# Patient Record
Sex: Female | Born: 1945
Health system: Southern US, Community
[De-identification: ages and names within clinical notes are randomized; demographics above are authoritative.]

## PROBLEM LIST (undated history)

## (undated) HISTORY — PX: ABDOMINAL HYSTERECTOMY: SHX81

## (undated) HISTORY — PX: CHOLECYSTECTOMY: SHX55

## (undated) HISTORY — PX: APPENDECTOMY: SHX54

---

## 2000-11-25 ENCOUNTER — Other Ambulatory Visit: Admission: RE | Admit: 2000-11-25 | Discharge: 2000-11-25 | Payer: Self-pay | Admitting: *Deleted

## 2001-12-15 ENCOUNTER — Other Ambulatory Visit: Admission: RE | Admit: 2001-12-15 | Discharge: 2001-12-15 | Payer: Self-pay | Admitting: *Deleted

## 2005-11-25 ENCOUNTER — Other Ambulatory Visit: Admission: RE | Admit: 2005-11-25 | Discharge: 2005-11-25 | Payer: Self-pay | Admitting: Obstetrics and Gynecology

## 2006-01-27 ENCOUNTER — Encounter (INDEPENDENT_AMBULATORY_CARE_PROVIDER_SITE_OTHER): Payer: Self-pay | Admitting: Specialist

## 2006-01-27 ENCOUNTER — Ambulatory Visit (HOSPITAL_COMMUNITY): Admission: RE | Admit: 2006-01-27 | Discharge: 2006-01-27 | Payer: Self-pay | Admitting: Gastroenterology

## 2008-04-05 ENCOUNTER — Emergency Department (HOSPITAL_COMMUNITY): Admission: EM | Admit: 2008-04-05 | Discharge: 2008-04-06 | Payer: Self-pay | Admitting: Emergency Medicine

## 2008-04-08 ENCOUNTER — Ambulatory Visit (HOSPITAL_COMMUNITY): Admission: RE | Admit: 2008-04-08 | Discharge: 2008-04-09 | Payer: Self-pay | Admitting: Orthopedic Surgery

## 2009-10-31 IMAGING — CR DG WRIST COMPLETE 3+V*L*
4 series · 4 of 4 positions shown · non-contrast
Comparison: None

CLINICAL DATA: Wrist injury marked deformity

LEFT WRIST - COMPLETE 3+ VIEW

[lat wrist]
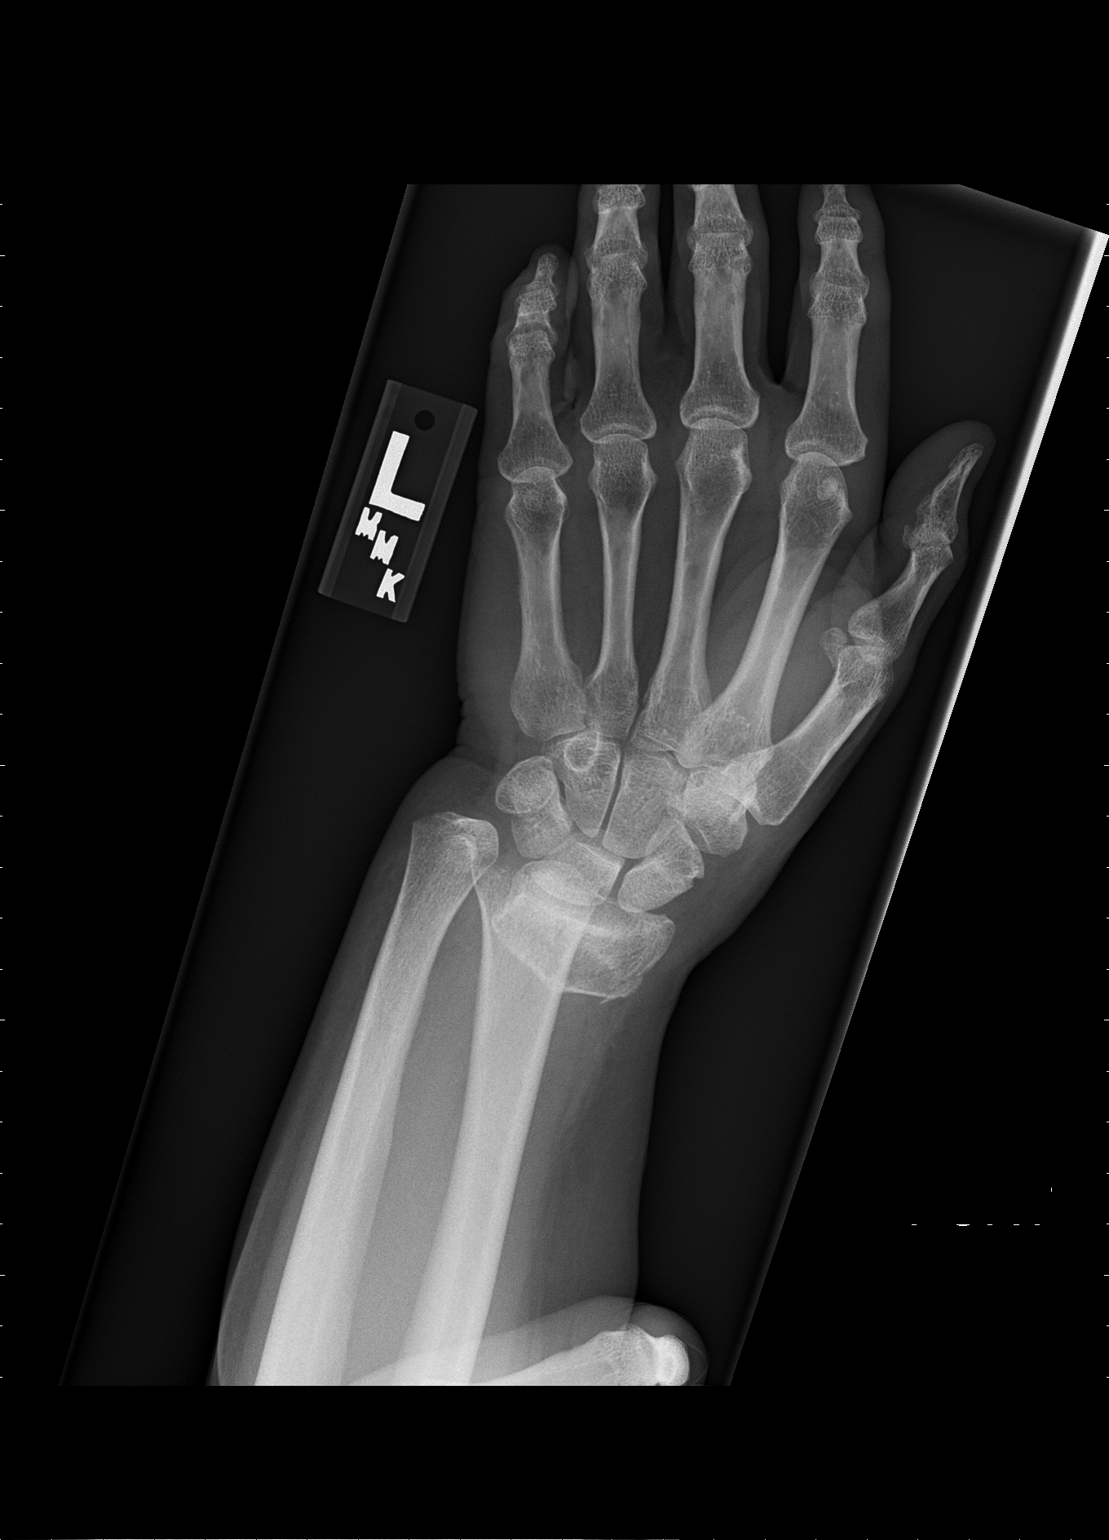

[PA (1 of 3)]
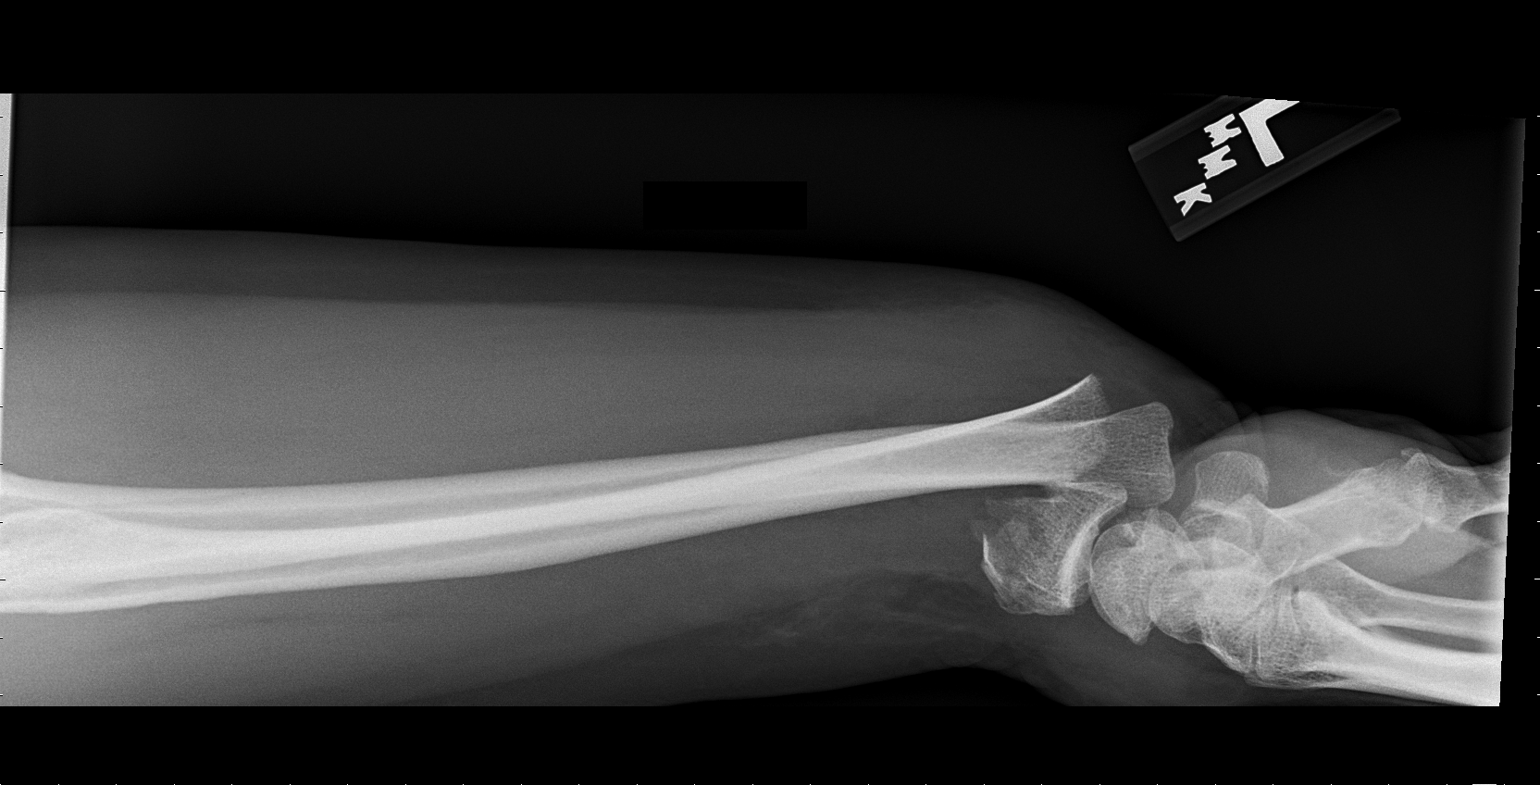

[PA (2 of 3)]
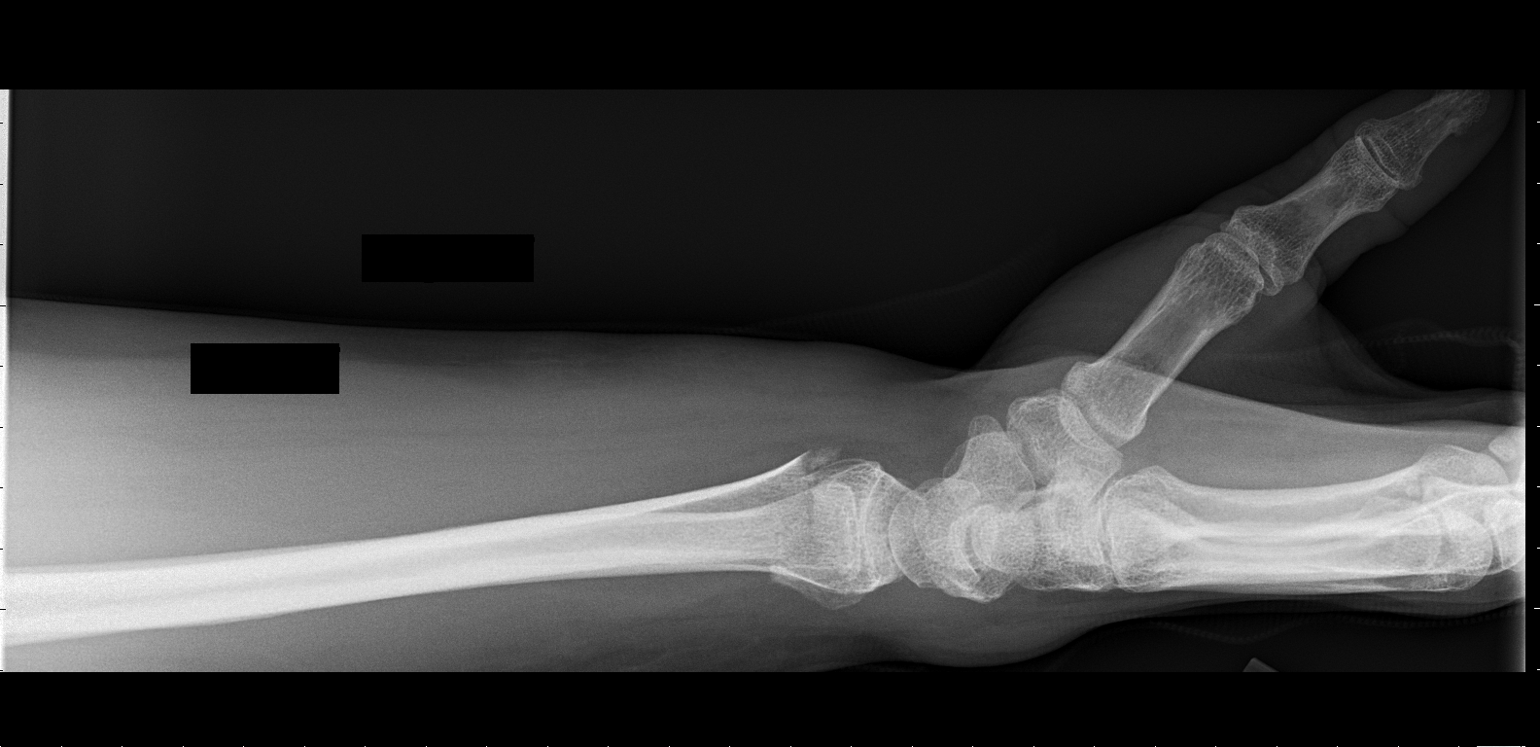

[PA (3 of 3)]
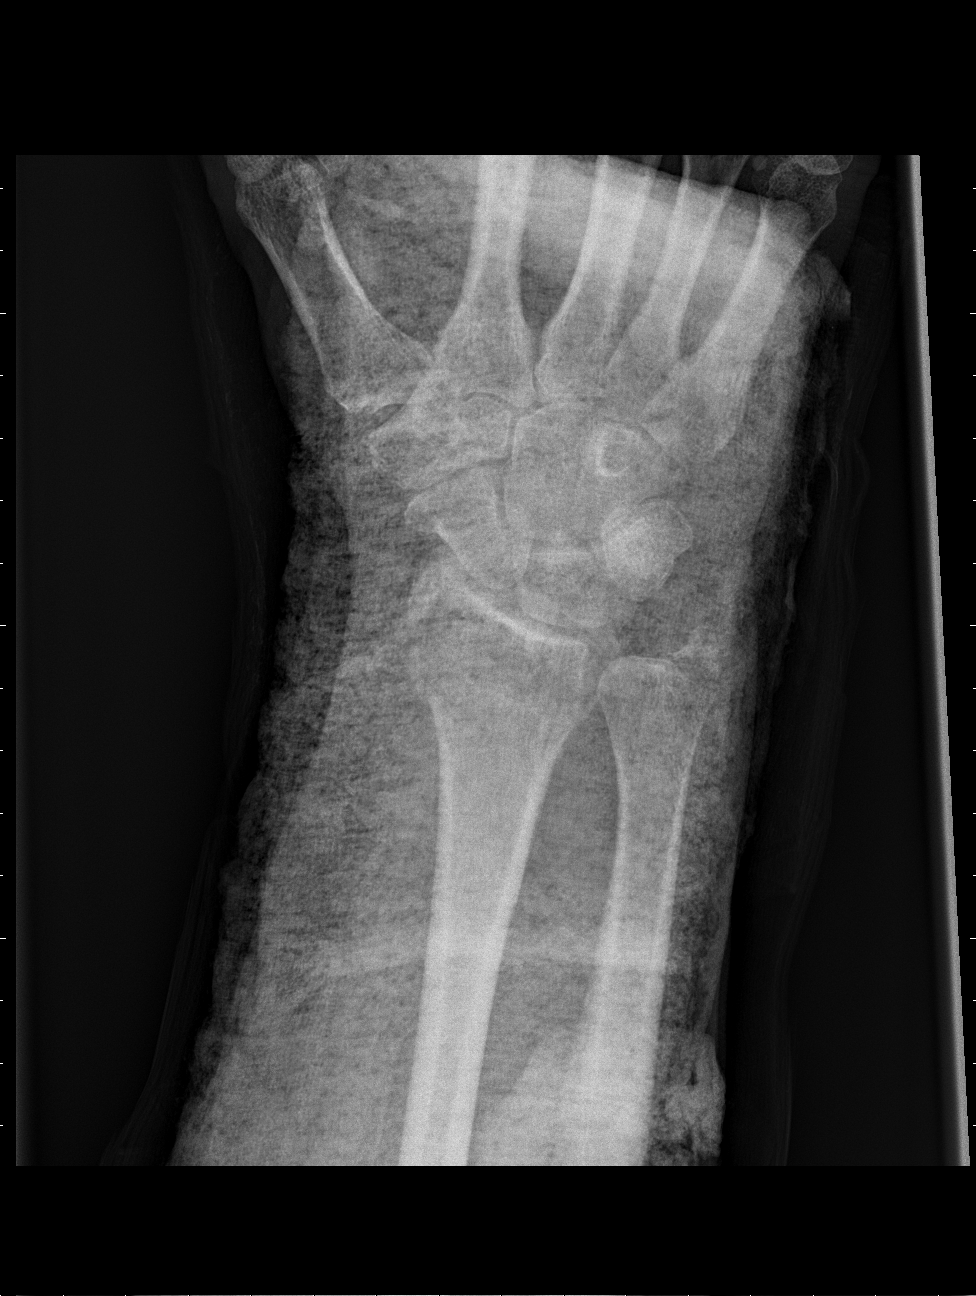

[4 of 4 positions shown; findings below may reference images not displayed]

FINDINGS: The initial radiograph showed a complete transverse
distal radial metaphyseal fracture with one full bone width
inferior displacement.  Associated volar dislocation of the ulna.
Subsequent post reduction radiographs demonstrate improved
alignment with near anatomic positioning.  AP in plaster shows near
anatomic alignment.
IMPRESSION: Distal radial fracture with volar displacement of the distal ulna.
Post reduction shows near anatomic alignment

## 2011-05-14 NOTE — H&P (Signed)
NAMEPENNY, ARRAMBIDE              ACCOUNT NO.:  [id number]   MEDICAL RECORD NO.:  [id number]           PATIENT TYPE:   LOCATION:                                 FACILITY:   PHYSICIAN:  Dionne Ano. Gramig III, M.D.DATE OF BIRTH:  04/29/1946   DATE OF ADMISSION:  04/05/2008  DATE OF DISCHARGE:                              HISTORY & PHYSICAL   Joyce Stevens is a 65 year old, right-hand dominant female found with  outstretched left upper extremity completely displaced distal radius and  ulna fracture April 05, 2008. She presented to the emergency room after  10:00 p.m.  I was asked to see in consult.  She denies other injury.  She is here today with her daughter.  She has a severely displaced left  distal radius fracture.  I have reviewed this issue with her at length.   ALLERGIES:  None.   MEDICINES:  Aspirin.   PAST MEDICAL HISTORY:  Occasional headache.   PAST SURGICAL HISTORY:  1. Hysterectomy.  2. Cholecystectomy.  3. C-section.   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 She does  not smoke or drink.   PHYSICAL EXAMINATION:  GENERAL:  Pleasant female, alert and oriented, no  acute distress.  Bilateral lower extremity examination is benign.  CHEST:  Clear.  ABDOMEN:  Nontender.  EXTREMITIES:  Right upper extremity has IV in the antecubital fossa with  infiltrate.  We have switched that to her wrist and hand-based IV.  She  is neurovascularly intact in right upper extremity.  Left lower  extremity shows a significantly displaced distal radius fracture, closed  with ecchymosis and abrasion with stable ligamentous examination. She  has difficulty moving the fingers but is sensate about the radial,  median, ulnar nerve distribution.   I reviewed this at length.  I have gone ahead and evaluated her x-rays  and found a completely displaced distal radius and ulna fracture.  I  performed a hematoma block, and, following this, she underwent  manipulative  reduction after I verbally consented her. Manipulative  reduction was performed, and this placed the bone in excellent position.  I was pleased with the findings. Following placing the bone in excellent  position, post reduction x-rays were taken which showed excellent  position in the AP and lateral view.  She tolerated this well.  There  were no complicating features.  She has some tingling in her fingers  which we attribute to the lidocaine hematoma block and the reduction.  We are going to keep our eyes very close any evolving median nerve  issues or acute carpal tunnel syndrome.  Given the widely displaced  nature of her fracture, I would expect some degree of contusive injury  as well to the nerve.   I would recommend definitive stabilization at a convenient time in the  future and discussed with her the risks and benefits of bleeding,  infection, anesthesia, damage to normal structures, and failure of  surgery to accomplish its intended goals of relieving symptoms and  restoring function, typically patent screw fixation, 23-hour overnight  stay, preoperative antibiotics, pain  management, etc.  I have discharged  her tonight on Phenergan p.r.n. nausea, Dilaudid for pain, and in  addition to this, ice, elevation, finger movement, and call me for any  problems.  We are going to set her up for elective outpatient ORIF at a  mutually convenient time in the future.  If she should have any  worsening problems of evidence of compartment syndrome, she will let me  know.  At the present time, there is no evidence of compartment  syndrome, and she is stable.  She is awake, alert, and oriented at time  of discharge and understands the surgical algorithm and plans.           ______________________________  Dionne Ano. Everlene Other, M.D.     Nash Mantis  D:  04/05/2008  T:  04/06/2008  Job:  [id number]

## 2011-05-14 NOTE — Op Note (Signed)
NAMELASHONNE, SHULL              ACCOUNT NO.:  [id number]   MEDICAL RECORD NO.:  [id number]          PATIENT TYPE:  AMB   LOCATION:  DAY                          FACILITY:  [facility]   PHYSICIAN:  Dionne Ano. Gramig III, M.D.DATE OF BIRTH:  09-Apr-1946   DATE OF PROCEDURE:  04/08/2008  DATE OF DISCHARGE:                               OPERATIVE REPORT   PREOPERATIVE DIAGNOSIS:  Comminuted left wrist fracture status post  initial closed reduction presents for open reduction internal fixation  and reconstruction as necessary.   POSTOPERATIVE DIAGNOSIS:  Comminuted left wrist fracture status post  initial closed reduction presents for open reduction internal fixation  and reconstruction as necessary.   PROCEDURE:  1. Open reduction internal fixation with DVR plate and screw construct      and OsteoSet bone graft (5 mL) left distal radius fracture,      comminuted complex greater than five parts.  2. Stretch radiography.  3. Brachioradialis tenotomy.   SURGEON:  Dominica Severin, M.D.   ASSISTANT:  Karie Chimera, PA-C.   COMPLICATIONS:  None.   ANESTHESIA:  General.   TOURNIQUET TIME:  Less than an hour.   DRAINS:  One TLS drain.   INDICATIONS FOR PROCEDURE:  The patient is a pleasant 51-year-year-old  female who presents with above mentioned diagnosis.  I have counseled  her in regards to the risks and benefits of surgery including risk of  infection, bleeding, anesthesia, damage to internal structures and  failure of surgery to accomplish its intended goals of relieving  symptoms and restoring function.  With this in mind, she desires to  proceed.  All questions have been encouraged and answered  preoperatively.   OPERATIVE PROCEDURE:  The patient was seen by myself and anesthesia,  taken to the operating suite and had induction of general anesthesia,  time-out was called.  Ancef was given preoperatively.  Arm was marked  and all questions were encouraged and answered.  The  operation commenced  with volar radial approach to the wrist under 250 mm of tourniquet  control approximately and a sterile prep and drape performed prior to  the incision.  Dissection was carried down.  The FCR tendon sheath was  incised palmarly and distally.  I performed a fasciotomy of the volar  soft tissues and following this the pronator was elevated in a radial to  ulnar direction exposing the fracture site.  The fracture was very  comminuted.  In order to gain reduction, I very carefully slid retractor  and performed a brachioradialis tenotomy.  This allowed for a lessening  radial pull to allow for fracture reduction.  Following the  brachioradialis tenotomy, I then placed a large amount of bone graft in  the form of 5 mL of OsteoSet bone graft.  I then reassembled the bony  fracture.  I was able to create excellent alignment and applied a DVR  plate in standard AO technique.  Radial inclination, volar tilt and  radial height were excellent and I was pleased with the findings.  The  distal radioulnar joint was stable and not encroached  upon by  the  hardware.  Following this, I checked stretch radiography revealing  excellent position of the fracture construct and good stability and I  was pleased with the findings.  I deflated tourniquet, irrigated  copiously with multiple lavages of irrigant and then closed the pronator  with Vicryl, subcu was closed with Vicryl and Prolene was used in the  skin edge.  The patient had a small TLS drain placed in the deep  tissues.  This will be removed tomorrow.  She had soft compartments.  No  complicating features.  She will be a placed a short-arm splint and  transferred to the recovery room.  She tolerated this well.  There were  no complicating features.  She will be admitted for IV antibiotics, pain  control and general postop observatory measures.  We will plan for a  standard DVR plate and screw construct rehabilitation protocol.  I  will  begin interval active range of motion at 4 weeks postop with removable  splint and at 8 weeks postop general interval strengthening predicated  on x-rays looking well.  It has been a pleasure to participate in her  care.  She is a Scientist, product/process development. No blood products were used.  I had a  thorough and thoughtful conversation with her in regards to this prior  to the procedure of course.           ______________________________  Dionne Ano. Everlene Other, M.D.     Nash Mantis  D:  04/08/2008  T:  04/08/2008  Job:  [id number]

## 2011-05-17 NOTE — Op Note (Signed)
NAMEOSCAR, FORMAN              ACCOUNT NO.:  [id number]   MEDICAL RECORD NO.:  [id number]          PATIENT TYPE:  AMB   LOCATION:  ENDO                         FACILITY:  [facility]   PHYSICIAN:  Bernette Redbird, M.D.   DATE OF BIRTH:  February 03, 1946   DATE OF PROCEDURE:  01/27/2006  DATE OF DISCHARGE:                                 OPERATIVE REPORT   PROCEDURE:  Colonoscopy with biopsy.   INDICATIONS:  Initial screening exam in a standard risk 65 year old Jehovah  Witness.   FINDINGS:  Small polyp removed. Small sigmoid AVM.   DESCRIPTION OF PROCEDURE:  The nature, purpose, and risks of the procedure  had been discussed with the patient who provided written consent. Sedation  was fentanyl 75 mcg and Versed 7 mg IV without arrhythmias or desaturation.  The Olympus adjustable tension pediatric video colonoscope was advanced to  the cecum without significant difficulty, using a little bit of external  abdominal compression to control looping. The cecum was clearly identified  by visualization of the appendiceal orifice and pullback was then performed.  The quality of prep was excellent and it is felt that all areas were well  seen.   On the way in, near the hepatic flexure, I encountered a 4 mm  semipedunculated polyp removed by two cold biopsies. There was a minimal  ooze, perhaps a quarter of a mL of blood at most, which was endoscopically  confirmed to clot with complete hemostasis thereafter.   No other polyps were seen.   In the sigmoid region was a red blotch approximately 1.5 cm x 1 cm in  diameter, possibly representing an AVM although discrete telangiectatic  vessels within it could not really be identified.   The colon was otherwise normal. No cancer, large polyps, colitis, vascular  malformations or diverticulosis were noted. Retroflexion in the rectum and  reinspection of the rectum were unremarkable. The patient tolerated the  procedure well and there were no apparent  complications.   IMPRESSION:  1.  Colon polyp removed as described above (211.3).  2.  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   PLAN:  Await pathology results.           ______________________________  Bernette Redbird, M.D.     RB/MEDQ  D:  01/27/2006  T:  01/27/2006  Job:  [id number]   cc:   Aram Beecham P. Romine, M.D.  Fax: [phone]   [facility] at [facility]

## 2011-09-24 LAB — HEMOGLOBIN AND HEMATOCRIT, BLOOD
HCT: 38.3
Hemoglobin: 13.2

## 2012-08-21 ENCOUNTER — Ambulatory Visit: Payer: PRIVATE HEALTH INSURANCE | Attending: Family Medicine | Admitting: Occupational Therapy

## 2012-08-21 DIAGNOSIS — M2550 Pain in unspecified joint: Secondary | ICD-10-CM | POA: Insufficient documentation

## 2012-08-21 DIAGNOSIS — IMO0001 Reserved for inherently not codable concepts without codable children: Secondary | ICD-10-CM | POA: Insufficient documentation

## 2012-08-21 DIAGNOSIS — M6281 Muscle weakness (generalized): Secondary | ICD-10-CM | POA: Insufficient documentation

## 2012-09-02 ENCOUNTER — Ambulatory Visit: Payer: PRIVATE HEALTH INSURANCE | Attending: Family Medicine

## 2012-09-02 DIAGNOSIS — M6281 Muscle weakness (generalized): Secondary | ICD-10-CM | POA: Insufficient documentation

## 2012-09-02 DIAGNOSIS — M2550 Pain in unspecified joint: Secondary | ICD-10-CM | POA: Insufficient documentation

## 2012-09-02 DIAGNOSIS — Z5189 Encounter for other specified aftercare: Secondary | ICD-10-CM | POA: Insufficient documentation

## 2012-09-07 ENCOUNTER — Ambulatory Visit: Payer: PRIVATE HEALTH INSURANCE | Admitting: *Deleted

## 2012-09-11 ENCOUNTER — Encounter: Payer: Self-pay | Admitting: *Deleted

## 2012-09-14 ENCOUNTER — Encounter: Payer: Self-pay | Admitting: Occupational Therapy

## 2012-09-21 ENCOUNTER — Ambulatory Visit: Payer: PRIVATE HEALTH INSURANCE | Attending: Family Medicine | Admitting: Occupational Therapy

## 2012-09-21 DIAGNOSIS — IMO0001 Reserved for inherently not codable concepts without codable children: Secondary | ICD-10-CM | POA: Insufficient documentation

## 2012-09-21 DIAGNOSIS — M2550 Pain in unspecified joint: Secondary | ICD-10-CM | POA: Insufficient documentation

## 2012-09-21 DIAGNOSIS — M6281 Muscle weakness (generalized): Secondary | ICD-10-CM | POA: Insufficient documentation

## 2012-09-25 ENCOUNTER — Ambulatory Visit: Payer: PRIVATE HEALTH INSURANCE | Admitting: Occupational Therapy

## 2012-09-28 ENCOUNTER — Encounter: Payer: Self-pay | Admitting: Occupational Therapy

## 2012-10-02 ENCOUNTER — Encounter: Payer: Self-pay | Admitting: Occupational Therapy

## 2012-10-05 ENCOUNTER — Encounter: Payer: Self-pay | Admitting: Occupational Therapy

## 2012-10-09 ENCOUNTER — Encounter: Payer: Self-pay | Admitting: Occupational Therapy

## 2012-10-12 ENCOUNTER — Encounter: Payer: Self-pay | Admitting: Occupational Therapy

## 2012-10-16 ENCOUNTER — Encounter: Payer: Self-pay | Admitting: Occupational Therapy

## 2016-02-15 ENCOUNTER — Encounter (HOSPITAL_COMMUNITY): Payer: Self-pay | Admitting: Emergency Medicine

## 2016-02-15 ENCOUNTER — Emergency Department (HOSPITAL_COMMUNITY): Payer: Medicare Other

## 2016-02-15 ENCOUNTER — Emergency Department (HOSPITAL_COMMUNITY)
Admission: EM | Admit: 2016-02-15 | Discharge: 2016-02-15 | Disposition: A | Discharge status: Home / Self Care | Payer: Medicare Other | Attending: Emergency Medicine | Admitting: Emergency Medicine

## 2016-02-15 DIAGNOSIS — R110 Nausea: Secondary | ICD-10-CM | POA: Insufficient documentation

## 2016-02-15 DIAGNOSIS — I959 Hypotension, unspecified: Secondary | ICD-10-CM | POA: Insufficient documentation

## 2016-02-15 DIAGNOSIS — Z90710 Acquired absence of both cervix and uterus: Secondary | ICD-10-CM | POA: Insufficient documentation

## 2016-02-15 DIAGNOSIS — R6883 Chills (without fever): Secondary | ICD-10-CM | POA: Diagnosis not present

## 2016-02-15 DIAGNOSIS — R55 Syncope and collapse: Secondary | ICD-10-CM | POA: Diagnosis not present

## 2016-02-15 DIAGNOSIS — R42 Dizziness and giddiness: Secondary | ICD-10-CM | POA: Insufficient documentation

## 2016-02-15 DIAGNOSIS — R682 Dry mouth, unspecified: Secondary | ICD-10-CM | POA: Diagnosis not present

## 2016-02-15 DIAGNOSIS — R001 Bradycardia, unspecified: Secondary | ICD-10-CM | POA: Insufficient documentation

## 2016-02-15 DIAGNOSIS — R1011 Right upper quadrant pain: Secondary | ICD-10-CM | POA: Diagnosis present

## 2016-02-15 DIAGNOSIS — Z9049 Acquired absence of other specified parts of digestive tract: Secondary | ICD-10-CM | POA: Insufficient documentation

## 2016-02-15 LAB — URINALYSIS, ROUTINE W REFLEX MICROSCOPIC
Bilirubin Urine: NEGATIVE
Glucose, UA: NEGATIVE mg/dL
Hgb urine dipstick: NEGATIVE
Ketones, ur: NEGATIVE mg/dL
Leukocytes, UA: NEGATIVE
Nitrite: NEGATIVE
Protein, ur: NEGATIVE mg/dL
Specific Gravity, Urine: 1.005 (ref 1.005–1.030)
pH: 6 (ref 5.0–8.0)

## 2016-02-15 LAB — TROPONIN I: Troponin I: <0.03 ng/mL

## 2016-02-15 LAB — COMPREHENSIVE METABOLIC PANEL WITH GFR
ALT: 44 U/L (ref 14–54)
AST: 39 U/L (ref 15–41)
Albumin: 3.3 g/dL — ABNORMAL LOW (ref 3.5–5.0)
Alkaline Phosphatase: 44 U/L (ref 38–126)
Anion gap: 11 (ref 5–15)
BUN: 17 mg/dL (ref 6–20)
CO2: 22 mmol/L (ref 22–32)
Calcium: 8.9 mg/dL (ref 8.9–10.3)
Chloride: 109 mmol/L (ref 101–111)
Creatinine, Ser: 0.78 mg/dL (ref 0.44–1.00)
GFR calc Af Amer: >60 mL/min
GFR calc non Af Amer: >60 mL/min
Glucose, Bld: 128 mg/dL — ABNORMAL HIGH (ref 65–99)
Potassium: 4.1 mmol/L (ref 3.5–5.1)
Sodium: 142 mmol/L (ref 135–145)
Total Bilirubin: 0.5 mg/dL (ref 0.3–1.2)
Total Protein: 6.5 g/dL (ref 6.5–8.1)

## 2016-02-15 LAB — CBC
HCT: 36 % (ref 36.0–46.0)
Hemoglobin: 11.6 g/dL — ABNORMAL LOW (ref 12.0–15.0)
MCH: 29.5 pg (ref 26.0–34.0)
MCHC: 32.2 g/dL (ref 30.0–36.0)
MCV: 91.6 fL (ref 78.0–100.0)
Platelets: 361 10*3/uL (ref 150–400)
RBC: 3.93 MIL/uL (ref 3.87–5.11)
RDW: 12.7 % (ref 11.5–15.5)
WBC: 9.4 10*3/uL (ref 4.0–10.5)

## 2016-02-15 LAB — MAGNESIUM: Magnesium: 2.1 mg/dL (ref 1.7–2.4)

## 2016-02-15 LAB — LIPASE, BLOOD: Lipase: 20 U/L (ref 11–51)

## 2016-02-15 MED ORDER — SODIUM CHLORIDE 0.9 % IV BOLUS (SEPSIS)
1000.0000 mL | Freq: Once | INTRAVENOUS | Status: AC
  Administered 2016-02-15: 1000 mL via INTRAVENOUS

## 2016-02-15 NOTE — Discharge Instructions (Signed)
Joyce Stevens, you work up today was normal.  You likely had an episode of vasovagal bradycardia.  You need to see your primary care doctor or cardiologist within 3 days for close follow up.  If any symptoms worsen, come back to the ED Immediately.  Thank you.   Syncope, commonly known as fainting, is a temporary loss of consciousness. It occurs when the blood flow to the brain is reduced. Vasovagal syncope (also called neurocardiogenic syncope) is a fainting spell in which the blood flow to the brain is reduced because of a sudden drop in heart rate and blood pressure. Vasovagal syncope occurs when the brain and the cardiovascular system (blood vessels) do not adequately communicate and respond to each other. This is the most common cause of fainting. It often occurs in response to fear or some other type of emotional or physical stress. The body has a reaction in which the heart starts beating too slowly or the blood vessels expand, reducing blood pressure. This type of fainting spell is generally considered harmless. However, injuries can occur if a person takes a sudden fall during a fainting spell.  CAUSES  Vasovagal syncope occurs when a person's blood pressure and heart rate decrease suddenly, usually in response to a trigger. Many things and situations can trigger an episode. Some of these include:   Pain.   Fear.   The sight of blood or medical procedures, such as blood being drawn from a vein.   Common activities, such as coughing, swallowing, stretching, or going to the bathroom.   Emotional stress.   Prolonged standing, especially in a warm environment.   Lack of sleep or rest.   Prolonged lack of food.   Prolonged lack of fluids.   Recent illness.  The use of certain drugs that affect blood pressure, such as cocaine, alcohol, marijuana, inhalants, and opiates.  SYMPTOMS  Before the fainting episode, you may:   Feel dizzy or light headed.   Become pale.  Sense  that you are going to faint.   Feel like the room is spinning.   Have tunnel vision, only seeing directly in front of you.   Feel sick to your stomach (nauseous).   See spots or slowly lose vision.   Hear ringing in your ears.   Have a headache.   Feel warm and sweaty.   Feel a sensation of pins and needles. During the fainting spell, you will generally be unconscious for no longer than a couple minutes before waking up and returning to normal. If you get up too quickly before your body can recover, you may faint again. Some twitching or jerky movements may occur during the fainting spell.  DIAGNOSIS  Your health care provider will ask about your symptoms, take a medical history, and perform a physical exam. Various tests may be done to rule out other causes of fainting. These may include blood tests and tests to check the heart, such as electrocardiography, echocardiography, and possibly an electrophysiology study. When other causes have been ruled out, a test may be done to check the body's response to changes in position (tilt table test). TREATMENT  Most cases of vasovagal syncope do not require treatment. Your health care provider may recommend ways to avoid fainting triggers and may provide home strategies for preventing fainting. If you must be exposed to a possible trigger, you can drink additional fluids to help reduce your chances of having an episode of vasovagal syncope. If you have warning signs of an  oncoming episode, you can respond by positioning yourself favorably (lying down). If your fainting spells continue, you may be given medicines to prevent fainting. Some medicines may help make you more resistant to repeated episodes of vasovagal syncope. Special exercises or compression stockings may be recommended. In rare cases, the surgical placement of a pacemaker is considered. HOME CARE INSTRUCTIONS   Learn to identify the warning signs of vasovagal syncope.   Sit  or lie down at the first warning sign of a fainting spell. If sitting, put your head down between your legs. If you lie down, swing your legs up in the air to increase blood flow to the brain.   Avoid hot tubs and saunas.  Avoid prolonged standing.  Drink enough fluids to keep your urine clear or pale yellow. Avoid caffeine.  Increase salt in your diet as directed by your health care provider.   If you have to stand for a long time, perform movements such as:   Crossing your legs.   Flexing and stretching your leg muscles.   Squatting.   Moving your legs.   Bending over.   Only take over-the-counter or prescription medicines as directed by your health care provider. Do not suddenly stop any medicines without asking your health care provider first. [place] IF:   Your fainting spells continue or happen more frequently in spite of treatment.   You lose consciousness for more than a couple minutes.  You have fainting spells during or after exercising or after being startled.   You have new symptoms that occur with the fainting spells, such as:   Shortness of breath.  Chest pain.   Irregular heartbeat.   You have episodes of twitching or jerky movements that last longer than a few seconds.  You have episodes of twitching or jerky movements without obvious fainting. SEEK IMMEDIATE MEDICAL CARE IF:   You have injuries or bleeding after a fainting spell.   You have episodes of twitching or jerky movements that last longer than 5 minutes.   You have more than one spell of twitching or jerky movements before returning to consciousness after fainting.   This information is not intended to replace advice given to you by your health care provider. Make sure you discuss any questions you have with your health care provider.   Document Released: 12/02/2012 Document Revised: 05/02/2015 Document Reviewed: 12/02/2012 Elsevier Interactive Patient Education  [employer].

## 2016-02-15 NOTE — ED Provider Notes (Addendum)
CSN: [id number]     Arrival date & time 02/15/16  0246 History  By signing my name below, I, Altamease Oiler, attest that this documentation has been prepared under the direction and in the presence of Everlene Balls, MD. Electronically Signed: Altamease Oiler, ED Scribe. 02/15/2016. 3:49 AM   Chief Complaint  Patient presents with  . Abdominal Pain  . Hypotension    The history is provided by the patient. No language interpreter was used.   Brought in by EMS, Joyce Stevens is a 70 y.o. female who presents to the Emergency Department complaining of new right upper abdominal pain with sudden onset just PTA after urinating. She is unable to describe the pain apart from saying that her stomach is "upset, like a stomach bug, like before you have diarrhea". Her pain has improved since initially onset. She notes having homemade soup and mixed fruit for dinner. Associated symptoms include dry mouth, chills, and light headedness . She was noted to be bradycardic by EMS. Pt denies change in appetite, sweating, vomiting, diarrhea, chest pain, SOB. She uses Excedrin frequently with milk. Past surgical history is significant for cholecystectomy, appendectomy, and abdominal hysterectomy.   History reviewed. No pertinent past medical history. Past Surgical History  Procedure Laterality Date  . Cholecystectomy    . Appendectomy    . Abdominal hysterectomy     Family History  Problem Relation Age of Onset  . Heart attack Brother    Social History  Substance Use Topics  . Smoking status: Never Smoker   . Smokeless tobacco: None  . Alcohol Use: No   OB History    No data available     Review of Systems  10 Systems reviewed and all are negative for acute change except as noted in the HPI.   Allergies  Review of patient's allergies indicates no known allergies.  Home Medications   Prior to Admission medications   Not on File   BP 150/86 mmHg  Pulse 67  Temp(Src) 98.1 F (36.7 C)  (Oral)  Resp 15  Ht 5\' 1"  (1.549 m)  Wt 155 lb (70.308 kg)  BMI 29.30 kg/m2  SpO2 100% Physical Exam  Constitutional: She is oriented to person, place, and time. She appears well-developed and well-nourished. No distress.  HENT:  Head: Normocephalic and atraumatic.  Nose: Nose normal.  Mouth/Throat: Oropharynx is clear and moist. No oropharyngeal exudate.  Eyes: Conjunctivae and EOM are normal. Pupils are equal, round, and reactive to light. No scleral icterus.  Neck: Normal range of motion. Neck supple. No JVD present. No tracheal deviation present. No thyromegaly present.  Cardiovascular: Normal rate, regular rhythm and normal heart sounds.  Exam reveals no gallop and no friction rub.   No murmur heard. Pulmonary/Chest: Effort normal and breath sounds normal. No respiratory distress. She has no wheezes. She exhibits no tenderness.  Abdominal: Soft. Bowel sounds are normal. She exhibits no distension and no mass. There is no tenderness. There is no rebound and no guarding.  Musculoskeletal: Normal range of motion. She exhibits no edema or tenderness.  Lymphadenopathy:    She has no cervical adenopathy.  Neurological: She is alert and oriented to person, place, and time. No cranial nerve deficit. She exhibits normal muscle tone.  Skin: Skin is warm and dry. No rash noted. No erythema. No pallor.  Nursing note and vitals reviewed.   ED Course  Procedures (including critical care time) DIAGNOSTIC STUDIES: Oxygen Saturation is 100% on RA,  normal by my  interpretation.    COORDINATION OF CARE: 3:36 AM Discussed treatment plan which includes lab work, EKG, and CXR with pt at bedside and pt agreed to plan.  Labs Review Labs Reviewed  COMPREHENSIVE METABOLIC PANEL - Abnormal; Notable for the following:    Glucose, Bld 128 (*)    Albumin 3.3 (*)    All other components within normal limits  CBC - Abnormal; Notable for the following:    Hemoglobin 11.6 (*)    All other components  within normal limits  LIPASE, BLOOD  URINALYSIS, ROUTINE W REFLEX MICROSCOPIC (NOT AT [facility])  MAGNESIUM  TROPONIN I    Imaging Review Dg Chest 2 View  02/15/2016  CLINICAL DATA:  Bradycardia.  Near syncope. EXAM: CHEST  2 VIEW COMPARISON:  None. FINDINGS: The heart size and mediastinal contours are within normal limits. Both lungs are clear. The visualized skeletal structures are unremarkable. IMPRESSION: No active cardiopulmonary disease. Electronically Signed   By: Lucienne Capers M.D.   On: 02/15/2016 04:24   I have personally reviewed and evaluated these images and lab results as part of my medical decision-making.   EKG Interpretation   Date/Time:  Thursday February 15 2016 02:53:43 EST Ventricular Rate:  68 PR Interval:  160 QRS Duration: 98 QT Interval:  429 QTC Calculation: 456 R Axis:   -4 Text Interpretation:  Sinus rhythm Low voltage, extremity and precordial  leads No significant change since last tracing Confirmed by Glynn Octave [phone]) on 02/15/2016 3:25:28 AM      MDM   Final diagnoses:  None    Patient presents emergency department for near syncope, nausea, and bradycardia. It is likely patient had a vasovagal episode. She was observed in the emergency department and given IV fluids. Infectious workup was negative, troponin is negative as well. Emergency depart workup is negative. EKG does not show any AV blocks. She is advised to follow with her primary care physician or cardiology within 3 days for close management. She appears well in no acute distress, patient states she is back to her baseline. Vital signs were within her normal limits and she is safe for discharge.    I personally performed the services described in this documentation, which was scribed in my presence. The recorded information has been reviewed and is accurate.      Everlene Balls, MD 02/15/16 [place], MD 02/15/16 [phone]

## 2016-02-15 NOTE — ED Notes (Signed)
Pt arrives by [facility] with c/o of LUQ discomfort, weakness, faint, chills, and nausea. Pt took Pepto and drank ginger ale, did not help. EMS reports HR in 40s and BP 68/43 on arrival. 20g in left AC and 441mL bolus. Pulse went 67 and BP 156/72. CBG 158. Pt denied SOB. No cardiac history.

## 2016-02-15 NOTE — ED Notes (Signed)
Patient transported to X-ray 

## 2016-02-22 NOTE — Progress Notes (Signed)
Cardiology Office Note   Date:  02/23/2016   ID:  TIMARA DOHNER, DOB 1946/09/17, MRN [id number]  PCP:  Aretta Nip, MD  Cardiologist:   Minus Breeding, MD   No chief complaint on file.     History of Present Illness: Joyce Stevens is a 70 y.o. female who presents for evaluation of presyncope. This happened on 2/17. She was in bed and had some discomfort.  This may have been related to food she ate. With this she had a feeling of dread near syncope. She became diaphoretic and nauseated.   She had a feeling like I was going through her veins. EMS was called and she was currently hypotensive and bradycardic. I reviewed emergency room records. This was thought to be a vagal episode. Her enzymes were normal except she is mildly anemic. EKG was unremarkable for acute changes although she has chronic poor anterior R wave progression. There were no other abnormalities. She's never had this kind of episode before and hasn't since. She otherwise exercises routinely.  She walks daily and does other exercises without any symptoms.   The patient denies any new symptoms such as chest discomfort, neck or arm discomfort. There has been no new shortness of breath, PND or orthopnea. There have been no reported palpitations, presyncope or syncope.  She does have significant stress and we discussed this in detail.  PMH:  Headaches   Past Surgical History  Procedure Laterality Date  . Cholecystectomy    . Appendectomy    . Abdominal hysterectomy       Current Outpatient Prescriptions  Medication Sig Dispense Refill  . fluticasone (FLONASE) 50 MCG/ACT nasal spray Place 2 sprays into both nostrils daily. USE TWO SPRAYS ONCE A DAY AS NEEDED NASALLY  5  . GARLIC PO Take 1 tablet by mouth daily.    . S-Adenosylmethionine (SAM-E PO) Take by mouth.    . S-Adenosylmethionine (SAM-E) 200 MG TABS Take 1 tablet by mouth daily.    . SUMAtriptan (IMITREX) 50 MG tablet Use as needed  2   No current  facility-administered medications for this visit.    Allergies:   Review of patient's allergies indicates no known allergies.    Social History:  The patient  reports that she has never smoked. She has never used smokeless tobacco. She reports that she does not drink alcohol or use illicit drugs.   Family History:  The patient's family history includes Heart attack in her brother.    ROS:  Please see the history of present illness.   Otherwise, review of systems are positive for fatigue.   All other systems are reviewed and negative.    PHYSICAL EXAM: VS:  BP 134/86 mmHg  Pulse 72  Ht 5\' 1"  (1.549 m)  Wt 160 lb (72.576 kg)  BMI 30.25 kg/m2 , BMI Body mass index is 30.25 kg/(m^2). GENERAL:  Well appearing HEENT:  Pupils equal round and reactive, fundi not visualized, oral mucosa unremarkable NECK:  No jugular venous distention, waveform within normal limits, carotid upstroke brisk and symmetric, no bruits, no thyromegaly LYMPHATICS:  No cervical, inguinal adenopathy LUNGS:  Clear to auscultation bilaterally BACK:  No CVA tenderness CHEST:  Unremarkable HEART:  PMI not displaced or sustained,S1 and S2 within normal limits, no S3, no S4, no clicks, no rubs, no murmurs ABD:  Flat, positive bowel sounds normal in frequency in pitch, no bruits, no rebound, no guarding, no midline pulsatile mass, no hepatomegaly, no splenomegaly EXT:  2 plus pulses throughout, no edema, no cyanosis no clubbing SKIN:  No rashes no nodules NEURO:  Cranial nerves II through XII grossly intact, motor grossly intact throughout PSYCH:  Cognitively intact, oriented to person place and time    EKG:  EKG is ordered today. The ekg ordered today demonstrates sinus rhythm, rate 73, axis within normal limits, intervals within normal limits, poor anterior R wave progression.  This is unchanged from previous EKGs   Recent Labs: 02/15/2016: ALT 44; BUN 17; Creatinine, Ser 0.78; Hemoglobin 11.6*; Magnesium 2.1;  Platelets 361; Potassium 4.1; Sodium 142    Lipid Panel No results found for: CHOL, TRIG, HDL, CHOLHDL, VLDL, LDLCALC, LDLDIRECT    Wt Readings from Last 3 Encounters:  02/23/16 160 lb (72.576 kg)  02/15/16 155 lb (70.308 kg)      Other studies Reviewed: Additional studies/ records that were reviewed today include: Dr. Dahlia Bailiff records, ED records. Review of the above records demonstrates:  Please see elsewhere in the note.     ASSESSMENT AND PLAN:  PRESYNCOPE:  This is consistent with a vagal episode. I reviewed the emergency room records and there were no acute findings. She's had no symptoms before or since. No change in therapy is indicated. I don't think further study would be helpful at this point. Certainly if she has any recurrent symptoms I would be happy to evaluate.  ANXIETY:  I encourage continued follow up with Aretta Nip, MD   Current medicines are reviewed at length with the patient today.  The patient does not have concerns regarding medicines.  The following changes have been made:  no change  Labs/ tests ordered today include: none  No orders of the defined types were placed in this encounter.     Disposition:   FU with me as needed.      Signed, Minus Breeding, MD  02/23/2016 1:59 PM    [facility]

## 2016-02-23 ENCOUNTER — Encounter: Payer: Self-pay | Admitting: Cardiology

## 2016-02-23 ENCOUNTER — Ambulatory Visit (INDEPENDENT_AMBULATORY_CARE_PROVIDER_SITE_OTHER): Payer: Medicare Other | Admitting: Cardiology

## 2016-02-23 VITALS — BP 134/86 | HR 72 | Ht 61.0 in | Wt 160.0 lb

## 2016-02-23 DIAGNOSIS — R55 Syncope and collapse: Secondary | ICD-10-CM

## 2016-02-23 NOTE — Patient Instructions (Signed)
Dr Hochrein recommends that you follow-up with him as needed. 

## 2017-01-20 DIAGNOSIS — R69 Illness, unspecified: Secondary | ICD-10-CM | POA: Diagnosis not present

## 2017-04-23 DIAGNOSIS — M79601 Pain in right arm: Secondary | ICD-10-CM | POA: Diagnosis not present

## 2017-05-05 DIAGNOSIS — G43009 Migraine without aura, not intractable, without status migrainosus: Secondary | ICD-10-CM | POA: Diagnosis not present

## 2017-05-05 DIAGNOSIS — Z6831 Body mass index (BMI) 31.0-31.9, adult: Secondary | ICD-10-CM | POA: Diagnosis not present

## 2017-05-05 DIAGNOSIS — R69 Illness, unspecified: Secondary | ICD-10-CM | POA: Diagnosis not present

## 2017-05-05 DIAGNOSIS — Z0000 Encounter for general adult medical examination without abnormal findings: Secondary | ICD-10-CM | POA: Diagnosis not present

## 2017-05-05 DIAGNOSIS — E669 Obesity, unspecified: Secondary | ICD-10-CM | POA: Diagnosis not present

## 2017-05-05 DIAGNOSIS — J301 Allergic rhinitis due to pollen: Secondary | ICD-10-CM | POA: Diagnosis not present

## 2017-05-05 DIAGNOSIS — G4700 Insomnia, unspecified: Secondary | ICD-10-CM | POA: Diagnosis not present

## 2017-05-05 DIAGNOSIS — M542 Cervicalgia: Secondary | ICD-10-CM | POA: Diagnosis not present

## 2017-09-03 DIAGNOSIS — Z1231 Encounter for screening mammogram for malignant neoplasm of breast: Secondary | ICD-10-CM | POA: Diagnosis not present

## 2017-09-03 DIAGNOSIS — Z803 Family history of malignant neoplasm of breast: Secondary | ICD-10-CM | POA: Diagnosis not present

## 2017-09-08 DIAGNOSIS — G43009 Migraine without aura, not intractable, without status migrainosus: Secondary | ICD-10-CM | POA: Diagnosis not present

## 2017-09-08 DIAGNOSIS — Z23 Encounter for immunization: Secondary | ICD-10-CM | POA: Diagnosis not present

## 2017-09-11 IMAGING — DX DG CHEST 2V
2 series · 2 of 2 positions shown · non-contrast
Comparison: None.

CLINICAL DATA: Bradycardia.  Near syncope.

EXAM:
CHEST  2 VIEW

[chest pa]
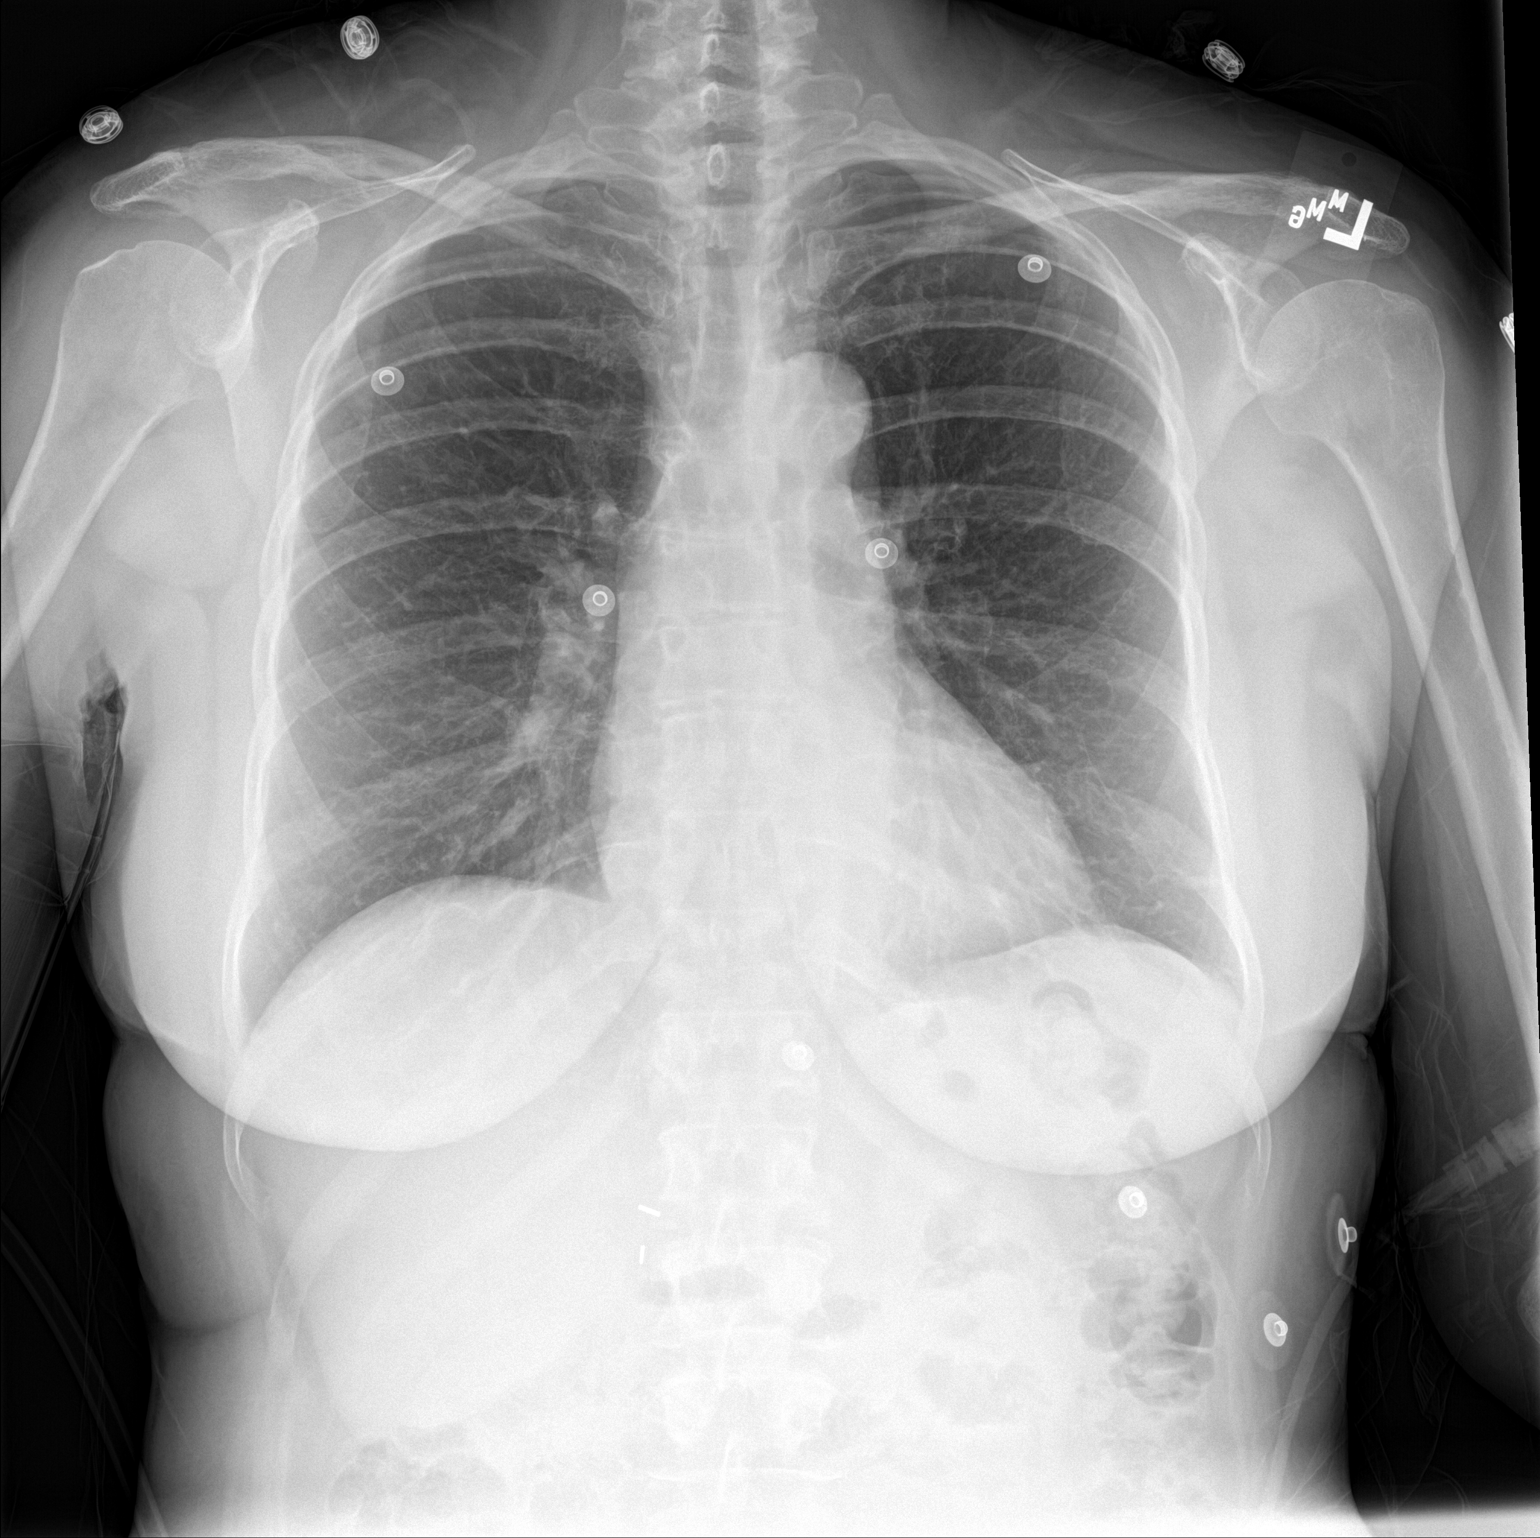

[chest lat]
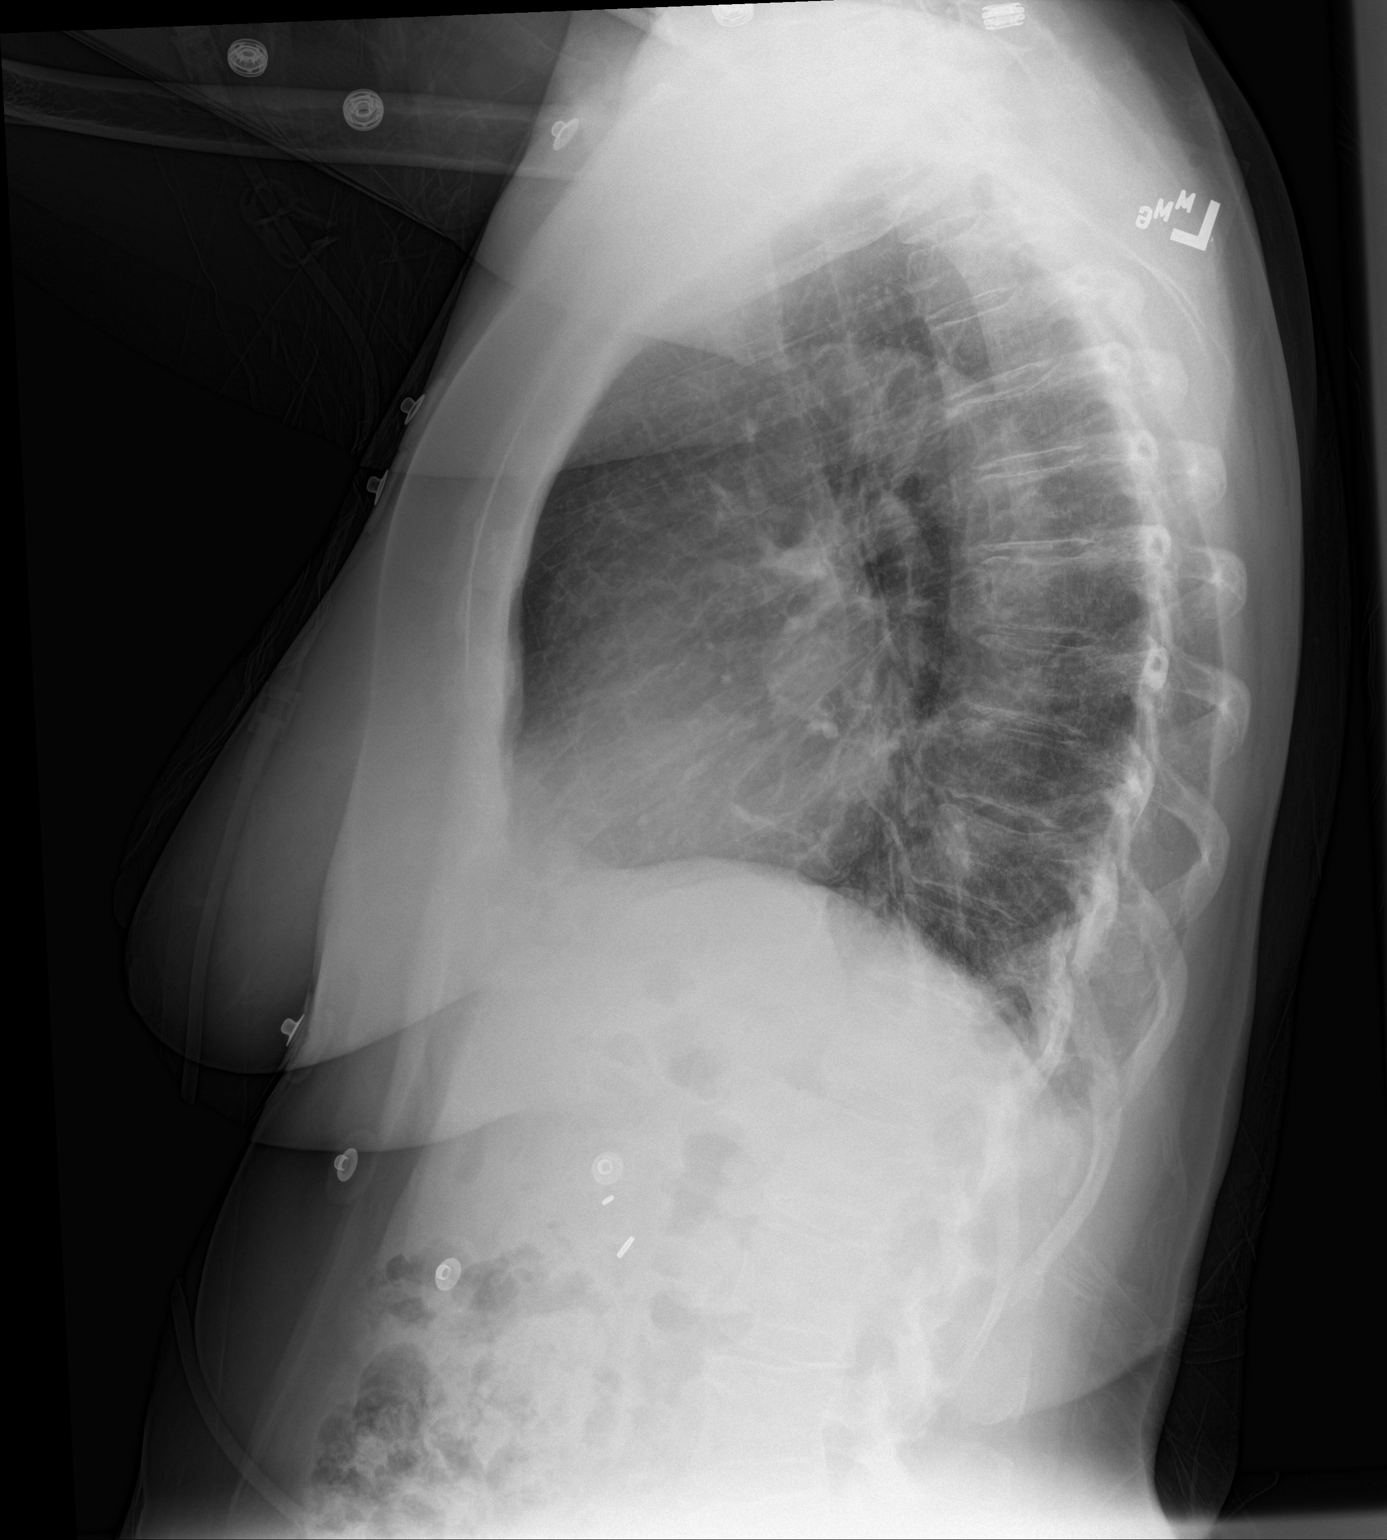

[2 of 2 positions shown; findings below may reference images not displayed]

FINDINGS: The heart size and mediastinal contours are within normal limits.
Both lungs are clear. The visualized skeletal structures are
unremarkable.
IMPRESSION: No active cardiopulmonary disease.

## 2017-10-03 DIAGNOSIS — R69 Illness, unspecified: Secondary | ICD-10-CM | POA: Diagnosis not present

## 2018-04-02 DIAGNOSIS — H40013 Open angle with borderline findings, low risk, bilateral: Secondary | ICD-10-CM | POA: Diagnosis not present

## 2018-04-04 DIAGNOSIS — Z0100 Encounter for examination of eyes and vision without abnormal findings: Secondary | ICD-10-CM | POA: Diagnosis not present

## 2018-04-10 DIAGNOSIS — Z1389 Encounter for screening for other disorder: Secondary | ICD-10-CM | POA: Diagnosis not present

## 2018-04-10 DIAGNOSIS — G43009 Migraine without aura, not intractable, without status migrainosus: Secondary | ICD-10-CM | POA: Diagnosis not present

## 2018-04-10 DIAGNOSIS — Z1159 Encounter for screening for other viral diseases: Secondary | ICD-10-CM | POA: Diagnosis not present

## 2018-04-10 DIAGNOSIS — Z0000 Encounter for general adult medical examination without abnormal findings: Secondary | ICD-10-CM | POA: Diagnosis not present

## 2018-04-10 DIAGNOSIS — Z79899 Other long term (current) drug therapy: Secondary | ICD-10-CM | POA: Diagnosis not present

## 2018-04-10 DIAGNOSIS — Z13220 Encounter for screening for lipoid disorders: Secondary | ICD-10-CM | POA: Diagnosis not present

## 2018-04-10 DIAGNOSIS — Z131 Encounter for screening for diabetes mellitus: Secondary | ICD-10-CM | POA: Diagnosis not present

## 2018-06-10 DIAGNOSIS — R69 Illness, unspecified: Secondary | ICD-10-CM | POA: Diagnosis not present

## 2018-09-09 DIAGNOSIS — Z1231 Encounter for screening mammogram for malignant neoplasm of breast: Secondary | ICD-10-CM | POA: Diagnosis not present

## 2018-10-06 DIAGNOSIS — R69 Illness, unspecified: Secondary | ICD-10-CM | POA: Diagnosis not present

## 2018-11-09 DIAGNOSIS — S61219A Laceration without foreign body of unspecified finger without damage to nail, initial encounter: Secondary | ICD-10-CM | POA: Diagnosis not present

## 2018-11-19 DIAGNOSIS — Z4802 Encounter for removal of sutures: Secondary | ICD-10-CM | POA: Diagnosis not present

## 2018-11-19 DIAGNOSIS — S61211D Laceration without foreign body of left index finger without damage to nail, subsequent encounter: Secondary | ICD-10-CM | POA: Diagnosis not present

## 2019-05-05 DIAGNOSIS — Z0100 Encounter for examination of eyes and vision without abnormal findings: Secondary | ICD-10-CM | POA: Diagnosis not present

## 2019-08-02 DIAGNOSIS — Z0000 Encounter for general adult medical examination without abnormal findings: Secondary | ICD-10-CM | POA: Diagnosis not present

## 2019-08-09 DIAGNOSIS — G43909 Migraine, unspecified, not intractable, without status migrainosus: Secondary | ICD-10-CM | POA: Diagnosis not present

## 2019-08-23 DIAGNOSIS — Z1211 Encounter for screening for malignant neoplasm of colon: Secondary | ICD-10-CM | POA: Diagnosis not present

## 2019-08-23 DIAGNOSIS — Z1212 Encounter for screening for malignant neoplasm of rectum: Secondary | ICD-10-CM | POA: Diagnosis not present

## 2019-09-07 DIAGNOSIS — R69 Illness, unspecified: Secondary | ICD-10-CM | POA: Diagnosis not present

## 2020-02-20 ENCOUNTER — Ambulatory Visit: Payer: Medicare HMO | Attending: Internal Medicine

## 2020-02-20 DIAGNOSIS — Z23 Encounter for immunization: Secondary | ICD-10-CM | POA: Insufficient documentation

## 2020-02-20 NOTE — Progress Notes (Signed)
   Covid-19 Vaccination Clinic  Name:  Joyce Stevens    MRN: [id number] DOB: 10/28/1946  02/20/2020  [name] was observed post Covid-19 immunization for 15 minutes without incidence. She was provided with Vaccine Information Sheet and instruction to access the V-Safe system.   [name] was instructed to call 911 with any severe reactions post vaccine: Marland Kitchen Difficulty breathing  . Swelling of your face and throat  . A fast heartbeat  . A bad rash all over your body  . Dizziness and weakness    Immunizations Administered    Name Date Dose VIS Date Route   Pfizer COVID-19 Vaccine 02/20/2020 10:12 AM 0.3 mL 12/10/2019 Intramuscular   Manufacturer: [place]   Lot: [id number]   [place]: [id number]

## 2020-03-15 ENCOUNTER — Ambulatory Visit: Payer: Medicare HMO | Attending: Internal Medicine

## 2020-03-15 DIAGNOSIS — Z23 Encounter for immunization: Secondary | ICD-10-CM

## 2020-03-15 NOTE — Progress Notes (Signed)
   Covid-19 Vaccination Clinic  Name:  Joyce Stevens    MRN: [id number] DOB: 07-14-1946  03/15/2020  [name] was observed post Covid-19 immunization for 15 minutes without incident. She was provided with Vaccine Information Sheet and instruction to access the V-Safe system.   [name] was instructed to call 911 with any severe reactions post vaccine: Marland Kitchen Difficulty breathing  . Swelling of face and throat  . A fast heartbeat  . A bad rash all over body  . Dizziness and weakness   Immunizations Administered    Name Date Dose VIS Date Route   Pfizer COVID-19 Vaccine 03/15/2020  8:34 AM 0.3 mL 12/10/2019 Intramuscular   Manufacturer: [place]   Lot: [id number]   [place]: [id number]

## 2020-08-08 DIAGNOSIS — H52203 Unspecified astigmatism, bilateral: Secondary | ICD-10-CM | POA: Diagnosis not present

## 2020-08-08 DIAGNOSIS — H524 Presbyopia: Secondary | ICD-10-CM | POA: Diagnosis not present

## 2020-08-08 DIAGNOSIS — H2513 Age-related nuclear cataract, bilateral: Secondary | ICD-10-CM | POA: Diagnosis not present

## 2020-09-14 DIAGNOSIS — Z0100 Encounter for examination of eyes and vision without abnormal findings: Secondary | ICD-10-CM | POA: Diagnosis not present

## 2020-09-26 DIAGNOSIS — R69 Illness, unspecified: Secondary | ICD-10-CM | POA: Diagnosis not present

## 2020-10-28 ENCOUNTER — Ambulatory Visit: Payer: Medicare HMO | Attending: Internal Medicine

## 2020-10-28 DIAGNOSIS — Z23 Encounter for immunization: Secondary | ICD-10-CM

## 2020-10-28 NOTE — Progress Notes (Signed)
   Covid-19 Vaccination Clinic  Name:  Joyce Stevens    MRN: [id number] DOB: Oct 22, 1946  10/28/2020  [name] was observed post Covid-19 immunization for 15 minutes without incident. She was provided with Vaccine Information Sheet and instruction to access the V-Safe system.   [name] was instructed to call 911 with any severe reactions post vaccine: Marland Kitchen Difficulty breathing  . Swelling of face and throat  . A fast heartbeat  . A bad rash all over body  . Dizziness and weakness

## 2020-11-01 DIAGNOSIS — L309 Dermatitis, unspecified: Secondary | ICD-10-CM | POA: Diagnosis not present

## 2020-11-01 DIAGNOSIS — D692 Other nonthrombocytopenic purpura: Secondary | ICD-10-CM | POA: Diagnosis not present

## 2020-11-01 DIAGNOSIS — L821 Other seborrheic keratosis: Secondary | ICD-10-CM | POA: Diagnosis not present

## 2020-11-01 DIAGNOSIS — L820 Inflamed seborrheic keratosis: Secondary | ICD-10-CM | POA: Diagnosis not present

## 2020-11-01 DIAGNOSIS — D1801 Hemangioma of skin and subcutaneous tissue: Secondary | ICD-10-CM | POA: Diagnosis not present

## 2020-11-09 DIAGNOSIS — H40013 Open angle with borderline findings, low risk, bilateral: Secondary | ICD-10-CM | POA: Diagnosis not present

## 2021-05-18 DIAGNOSIS — Z0100 Encounter for examination of eyes and vision without abnormal findings: Secondary | ICD-10-CM | POA: Diagnosis not present

## 2021-06-07 DIAGNOSIS — Z1231 Encounter for screening mammogram for malignant neoplasm of breast: Secondary | ICD-10-CM | POA: Diagnosis not present

## 2021-06-29 ENCOUNTER — Other Ambulatory Visit (HOSPITAL_BASED_OUTPATIENT_CLINIC_OR_DEPARTMENT_OTHER): Payer: Self-pay

## 2021-06-29 ENCOUNTER — Ambulatory Visit: Payer: Self-pay | Attending: Internal Medicine

## 2021-06-29 ENCOUNTER — Other Ambulatory Visit: Payer: Self-pay

## 2021-06-29 DIAGNOSIS — Z23 Encounter for immunization: Secondary | ICD-10-CM

## 2021-06-29 MED ORDER — PFIZER-BIONT COVID-19 VAC-TRIS 30 MCG/0.3ML IM SUSP
INTRAMUSCULAR | 0 refills | Status: AC
  Filled 2021-06-29: qty 0.3, 1d supply, fill #0

## 2021-06-29 NOTE — Progress Notes (Signed)
   Covid-19 Vaccination Clinic  Name:  Joyce Stevens    MRN: [id number] DOB: Mar 06, 1946  06/29/2021  [name] was observed post Covid-19 immunization for 15 minutes without incident. She was provided with Vaccine Information Sheet and instruction to access the V-Safe system.   [name] was instructed to call 911 with any severe reactions post vaccine: Difficulty breathing  Swelling of face and throat  A fast heartbeat  A bad rash all over body  Dizziness and weakness   Immunizations Administered     Name Date Dose VIS Date Route   PFIZER Comrnaty([place] TOP) Covid-19 Vaccine 06/29/2021 11:00 AM 0.3 mL 12/07/2020 Intramuscular   Manufacturer: [place]   Lot: [id number]   [place]: [phone]

## 2021-09-12 DIAGNOSIS — Z1389 Encounter for screening for other disorder: Secondary | ICD-10-CM | POA: Diagnosis not present

## 2021-09-12 DIAGNOSIS — Z0000 Encounter for general adult medical examination without abnormal findings: Secondary | ICD-10-CM | POA: Diagnosis not present

## 2021-09-19 DIAGNOSIS — Z0000 Encounter for general adult medical examination without abnormal findings: Secondary | ICD-10-CM | POA: Diagnosis not present

## 2021-09-19 DIAGNOSIS — N3941 Urge incontinence: Secondary | ICD-10-CM | POA: Diagnosis not present

## 2021-09-19 DIAGNOSIS — R5383 Other fatigue: Secondary | ICD-10-CM | POA: Diagnosis not present

## 2021-09-19 DIAGNOSIS — Z136 Encounter for screening for cardiovascular disorders: Secondary | ICD-10-CM | POA: Diagnosis not present

## 2021-09-19 DIAGNOSIS — G43009 Migraine without aura, not intractable, without status migrainosus: Secondary | ICD-10-CM | POA: Diagnosis not present

## 2021-09-19 DIAGNOSIS — J309 Allergic rhinitis, unspecified: Secondary | ICD-10-CM | POA: Diagnosis not present

## 2021-10-05 ENCOUNTER — Ambulatory Visit: Payer: Self-pay | Attending: Internal Medicine

## 2021-10-05 ENCOUNTER — Other Ambulatory Visit (HOSPITAL_BASED_OUTPATIENT_CLINIC_OR_DEPARTMENT_OTHER): Payer: Self-pay

## 2021-10-05 ENCOUNTER — Other Ambulatory Visit: Payer: Self-pay

## 2021-10-05 DIAGNOSIS — Z23 Encounter for immunization: Secondary | ICD-10-CM

## 2021-10-05 MED ORDER — PFIZER COVID-19 VAC BIVALENT 30 MCG/0.3ML IM SUSP
INTRAMUSCULAR | 0 refills | Status: AC
  Filled 2021-10-05: qty 0.3, 1d supply, fill #0

## 2021-10-05 NOTE — Progress Notes (Signed)
   Covid-19 Vaccination Clinic  Name:  Joyce Stevens    MRN: [id number] DOB: 1946/09/21  10/05/2021  Ms. [name] was observed post Covid-19 immunization for 15 minutes without incident. She was provided with Vaccine Information Sheet and instruction to access the V-Safe system.   Ms. [name] was instructed to call 911 with any severe reactions post vaccine: Difficulty breathing  Swelling of face and throat  A fast heartbeat  A bad rash all over body  Dizziness and weakness

## 2021-10-16 DIAGNOSIS — R5382 Chronic fatigue, unspecified: Secondary | ICD-10-CM | POA: Diagnosis not present

## 2021-10-16 DIAGNOSIS — Z8639 Personal history of other endocrine, nutritional and metabolic disease: Secondary | ICD-10-CM | POA: Diagnosis not present

## 2021-10-16 DIAGNOSIS — E039 Hypothyroidism, unspecified: Secondary | ICD-10-CM | POA: Diagnosis not present

## 2021-10-16 DIAGNOSIS — J309 Allergic rhinitis, unspecified: Secondary | ICD-10-CM | POA: Diagnosis not present

## 2021-10-16 DIAGNOSIS — Z6832 Body mass index (BMI) 32.0-32.9, adult: Secondary | ICD-10-CM | POA: Diagnosis not present

## 2021-10-16 DIAGNOSIS — R946 Abnormal results of thyroid function studies: Secondary | ICD-10-CM | POA: Diagnosis not present

## 2021-11-08 DIAGNOSIS — H5211 Myopia, right eye: Secondary | ICD-10-CM | POA: Diagnosis not present

## 2021-11-08 DIAGNOSIS — H40013 Open angle with borderline findings, low risk, bilateral: Secondary | ICD-10-CM | POA: Diagnosis not present

## 2021-12-10 DIAGNOSIS — E559 Vitamin D deficiency, unspecified: Secondary | ICD-10-CM | POA: Diagnosis not present

## 2021-12-10 DIAGNOSIS — E039 Hypothyroidism, unspecified: Secondary | ICD-10-CM | POA: Diagnosis not present

## 2021-12-10 DIAGNOSIS — Z8639 Personal history of other endocrine, nutritional and metabolic disease: Secondary | ICD-10-CM | POA: Diagnosis not present

## 2021-12-12 DIAGNOSIS — R202 Paresthesia of skin: Secondary | ICD-10-CM | POA: Diagnosis not present

## 2021-12-12 DIAGNOSIS — M7542 Impingement syndrome of left shoulder: Secondary | ICD-10-CM | POA: Diagnosis not present

## 2021-12-12 DIAGNOSIS — M25512 Pain in left shoulder: Secondary | ICD-10-CM | POA: Diagnosis not present

## 2021-12-17 DIAGNOSIS — E039 Hypothyroidism, unspecified: Secondary | ICD-10-CM | POA: Diagnosis not present

## 2021-12-17 DIAGNOSIS — E063 Autoimmune thyroiditis: Secondary | ICD-10-CM | POA: Diagnosis not present

## 2021-12-17 DIAGNOSIS — Z6832 Body mass index (BMI) 32.0-32.9, adult: Secondary | ICD-10-CM | POA: Diagnosis not present

## 2021-12-17 DIAGNOSIS — R5382 Chronic fatigue, unspecified: Secondary | ICD-10-CM | POA: Diagnosis not present

## 2021-12-17 DIAGNOSIS — J309 Allergic rhinitis, unspecified: Secondary | ICD-10-CM | POA: Diagnosis not present

## 2021-12-17 DIAGNOSIS — E010 Iodine-deficiency related diffuse (endemic) goiter: Secondary | ICD-10-CM | POA: Diagnosis not present

## 2022-01-03 DIAGNOSIS — E042 Nontoxic multinodular goiter: Secondary | ICD-10-CM | POA: Diagnosis not present

## 2022-01-03 DIAGNOSIS — E063 Autoimmune thyroiditis: Secondary | ICD-10-CM | POA: Diagnosis not present

## 2022-01-08 DIAGNOSIS — E042 Nontoxic multinodular goiter: Secondary | ICD-10-CM | POA: Diagnosis not present

## 2022-01-08 DIAGNOSIS — Z6832 Body mass index (BMI) 32.0-32.9, adult: Secondary | ICD-10-CM | POA: Diagnosis not present

## 2022-01-08 DIAGNOSIS — R5382 Chronic fatigue, unspecified: Secondary | ICD-10-CM | POA: Diagnosis not present

## 2022-01-08 DIAGNOSIS — J309 Allergic rhinitis, unspecified: Secondary | ICD-10-CM | POA: Diagnosis not present

## 2022-01-08 DIAGNOSIS — E039 Hypothyroidism, unspecified: Secondary | ICD-10-CM | POA: Diagnosis not present

## 2022-01-08 DIAGNOSIS — E063 Autoimmune thyroiditis: Secondary | ICD-10-CM | POA: Diagnosis not present

## 2022-01-17 ENCOUNTER — Other Ambulatory Visit: Payer: Self-pay | Admitting: Internal Medicine

## 2022-01-17 DIAGNOSIS — E042 Nontoxic multinodular goiter: Secondary | ICD-10-CM

## 2022-01-21 ENCOUNTER — Ambulatory Visit
Admission: RE | Admit: 2022-01-21 | Discharge: 2022-01-21 | Disposition: A | Discharge status: Home / Self Care | Payer: Self-pay | Source: Ambulatory Visit | Attending: Internal Medicine | Admitting: Internal Medicine

## 2022-01-21 ENCOUNTER — Other Ambulatory Visit: Payer: Self-pay | Admitting: Internal Medicine

## 2022-01-21 DIAGNOSIS — E042 Nontoxic multinodular goiter: Secondary | ICD-10-CM

## 2022-01-23 ENCOUNTER — Other Ambulatory Visit: Payer: Self-pay | Admitting: Internal Medicine

## 2022-01-23 DIAGNOSIS — E010 Iodine-deficiency related diffuse (endemic) goiter: Secondary | ICD-10-CM

## 2022-01-23 DIAGNOSIS — E042 Nontoxic multinodular goiter: Secondary | ICD-10-CM

## 2022-01-24 ENCOUNTER — Ambulatory Visit
Admission: RE | Admit: 2022-01-24 | Discharge: 2022-01-24 | Disposition: A | Discharge status: Home / Self Care | Payer: Medicare HMO | Source: Ambulatory Visit | Attending: Internal Medicine | Admitting: Internal Medicine

## 2022-01-24 DIAGNOSIS — E042 Nontoxic multinodular goiter: Secondary | ICD-10-CM

## 2022-01-24 DIAGNOSIS — E010 Iodine-deficiency related diffuse (endemic) goiter: Secondary | ICD-10-CM

## 2022-01-25 ENCOUNTER — Other Ambulatory Visit: Payer: Self-pay | Admitting: Internal Medicine

## 2022-01-25 DIAGNOSIS — E042 Nontoxic multinodular goiter: Secondary | ICD-10-CM

## 2022-02-05 ENCOUNTER — Ambulatory Visit
Admission: RE | Admit: 2022-02-05 | Discharge: 2022-02-05 | Disposition: A | Discharge status: Home / Self Care | Payer: Medicare HMO | Source: Ambulatory Visit | Attending: Internal Medicine | Admitting: Internal Medicine

## 2022-02-05 ENCOUNTER — Other Ambulatory Visit (HOSPITAL_COMMUNITY)
Admission: RE | Admit: 2022-02-05 | Discharge: 2022-02-05 | Disposition: A | Discharge status: Home / Self Care | Payer: Medicare HMO | Source: Ambulatory Visit | Attending: Radiology | Admitting: Radiology

## 2022-02-05 ENCOUNTER — Other Ambulatory Visit: Payer: Self-pay

## 2022-02-05 DIAGNOSIS — E042 Nontoxic multinodular goiter: Secondary | ICD-10-CM | POA: Diagnosis not present

## 2022-02-05 DIAGNOSIS — E041 Nontoxic single thyroid nodule: Secondary | ICD-10-CM | POA: Diagnosis not present

## 2022-02-06 LAB — CYTOLOGY - NON PAP

## 2022-02-08 DIAGNOSIS — E042 Nontoxic multinodular goiter: Secondary | ICD-10-CM | POA: Diagnosis not present

## 2022-02-11 DIAGNOSIS — R69 Illness, unspecified: Secondary | ICD-10-CM | POA: Diagnosis not present

## 2022-02-11 DIAGNOSIS — F411 Generalized anxiety disorder: Secondary | ICD-10-CM | POA: Diagnosis not present

## 2022-02-11 DIAGNOSIS — E063 Autoimmune thyroiditis: Secondary | ICD-10-CM | POA: Diagnosis not present

## 2022-02-11 DIAGNOSIS — D34 Benign neoplasm of thyroid gland: Secondary | ICD-10-CM | POA: Diagnosis not present

## 2022-02-18 ENCOUNTER — Encounter (HOSPITAL_COMMUNITY): Payer: Self-pay

## 2022-02-21 DIAGNOSIS — E063 Autoimmune thyroiditis: Secondary | ICD-10-CM | POA: Diagnosis not present

## 2022-02-21 DIAGNOSIS — E038 Other specified hypothyroidism: Secondary | ICD-10-CM | POA: Diagnosis not present

## 2022-02-21 DIAGNOSIS — E042 Nontoxic multinodular goiter: Secondary | ICD-10-CM | POA: Diagnosis not present

## 2022-03-18 DIAGNOSIS — E039 Hypothyroidism, unspecified: Secondary | ICD-10-CM | POA: Diagnosis not present

## 2022-03-20 DIAGNOSIS — Z6832 Body mass index (BMI) 32.0-32.9, adult: Secondary | ICD-10-CM | POA: Diagnosis not present

## 2022-03-20 DIAGNOSIS — R5382 Chronic fatigue, unspecified: Secondary | ICD-10-CM | POA: Diagnosis not present

## 2022-03-20 DIAGNOSIS — E039 Hypothyroidism, unspecified: Secondary | ICD-10-CM | POA: Diagnosis not present

## 2022-03-20 DIAGNOSIS — E063 Autoimmune thyroiditis: Secondary | ICD-10-CM | POA: Diagnosis not present

## 2022-03-20 DIAGNOSIS — E042 Nontoxic multinodular goiter: Secondary | ICD-10-CM | POA: Diagnosis not present

## 2022-03-20 DIAGNOSIS — J309 Allergic rhinitis, unspecified: Secondary | ICD-10-CM | POA: Diagnosis not present

## 2022-04-08 DIAGNOSIS — Z0100 Encounter for examination of eyes and vision without abnormal findings: Secondary | ICD-10-CM | POA: Diagnosis not present

## 2022-04-15 DIAGNOSIS — E042 Nontoxic multinodular goiter: Secondary | ICD-10-CM | POA: Diagnosis not present

## 2022-04-15 DIAGNOSIS — B070 Plantar wart: Secondary | ICD-10-CM | POA: Diagnosis not present

## 2022-04-15 DIAGNOSIS — E039 Hypothyroidism, unspecified: Secondary | ICD-10-CM | POA: Diagnosis not present

## 2022-09-19 DIAGNOSIS — E039 Hypothyroidism, unspecified: Secondary | ICD-10-CM | POA: Diagnosis not present

## 2022-09-26 DIAGNOSIS — E039 Hypothyroidism, unspecified: Secondary | ICD-10-CM | POA: Diagnosis not present

## 2022-09-26 DIAGNOSIS — E042 Nontoxic multinodular goiter: Secondary | ICD-10-CM | POA: Diagnosis not present

## 2022-09-26 DIAGNOSIS — E063 Autoimmune thyroiditis: Secondary | ICD-10-CM | POA: Diagnosis not present

## 2022-12-04 DIAGNOSIS — E039 Hypothyroidism, unspecified: Secondary | ICD-10-CM | POA: Diagnosis not present

## 2023-01-30 ENCOUNTER — Other Ambulatory Visit: Payer: Self-pay | Admitting: Surgery

## 2023-01-30 DIAGNOSIS — E063 Autoimmune thyroiditis: Secondary | ICD-10-CM

## 2023-01-30 DIAGNOSIS — E042 Nontoxic multinodular goiter: Secondary | ICD-10-CM

## 2023-02-20 ENCOUNTER — Ambulatory Visit
Admission: RE | Admit: 2023-02-20 | Discharge: 2023-02-20 | Disposition: A | Discharge status: Home / Self Care | Payer: Medicare HMO | Source: Ambulatory Visit | Attending: Surgery | Admitting: Surgery

## 2023-02-20 DIAGNOSIS — E042 Nontoxic multinodular goiter: Secondary | ICD-10-CM | POA: Diagnosis not present

## 2023-02-20 DIAGNOSIS — E063 Autoimmune thyroiditis: Secondary | ICD-10-CM

## 2023-02-20 DIAGNOSIS — E038 Other specified hypothyroidism: Secondary | ICD-10-CM

## 2023-02-20 DIAGNOSIS — E039 Hypothyroidism, unspecified: Secondary | ICD-10-CM | POA: Diagnosis not present

## 2023-02-25 DIAGNOSIS — E038 Other specified hypothyroidism: Secondary | ICD-10-CM | POA: Diagnosis not present

## 2023-02-25 DIAGNOSIS — E042 Nontoxic multinodular goiter: Secondary | ICD-10-CM | POA: Diagnosis not present

## 2023-02-25 DIAGNOSIS — E063 Autoimmune thyroiditis: Secondary | ICD-10-CM | POA: Diagnosis not present

## 2023-03-21 DIAGNOSIS — E039 Hypothyroidism, unspecified: Secondary | ICD-10-CM | POA: Diagnosis not present

## 2023-04-01 DIAGNOSIS — E042 Nontoxic multinodular goiter: Secondary | ICD-10-CM | POA: Diagnosis not present

## 2023-04-01 DIAGNOSIS — E039 Hypothyroidism, unspecified: Secondary | ICD-10-CM | POA: Diagnosis not present

## 2023-05-19 DIAGNOSIS — Z6833 Body mass index (BMI) 33.0-33.9, adult: Secondary | ICD-10-CM | POA: Diagnosis not present

## 2023-05-19 DIAGNOSIS — Z1211 Encounter for screening for malignant neoplasm of colon: Secondary | ICD-10-CM | POA: Diagnosis not present

## 2023-05-19 DIAGNOSIS — Z0000 Encounter for general adult medical examination without abnormal findings: Secondary | ICD-10-CM | POA: Diagnosis not present

## 2023-05-19 DIAGNOSIS — Z1389 Encounter for screening for other disorder: Secondary | ICD-10-CM | POA: Diagnosis not present

## 2023-06-03 DIAGNOSIS — Z6833 Body mass index (BMI) 33.0-33.9, adult: Secondary | ICD-10-CM | POA: Diagnosis not present

## 2023-06-03 DIAGNOSIS — G43009 Migraine without aura, not intractable, without status migrainosus: Secondary | ICD-10-CM | POA: Diagnosis not present

## 2023-06-03 DIAGNOSIS — E039 Hypothyroidism, unspecified: Secondary | ICD-10-CM | POA: Diagnosis not present

## 2023-06-03 DIAGNOSIS — Z136 Encounter for screening for cardiovascular disorders: Secondary | ICD-10-CM | POA: Diagnosis not present

## 2023-06-03 DIAGNOSIS — M549 Dorsalgia, unspecified: Secondary | ICD-10-CM | POA: Diagnosis not present

## 2023-06-03 DIAGNOSIS — F411 Generalized anxiety disorder: Secondary | ICD-10-CM | POA: Diagnosis not present

## 2023-06-03 DIAGNOSIS — E7841 Elevated Lipoprotein(a): Secondary | ICD-10-CM | POA: Diagnosis not present

## 2023-06-03 DIAGNOSIS — R5383 Other fatigue: Secondary | ICD-10-CM | POA: Diagnosis not present

## 2023-06-03 DIAGNOSIS — N3941 Urge incontinence: Secondary | ICD-10-CM | POA: Diagnosis not present

## 2023-06-09 DIAGNOSIS — Z1211 Encounter for screening for malignant neoplasm of colon: Secondary | ICD-10-CM | POA: Diagnosis not present

## 2023-06-09 DIAGNOSIS — Z1212 Encounter for screening for malignant neoplasm of rectum: Secondary | ICD-10-CM | POA: Diagnosis not present

## 2023-06-15 LAB — COLOGUARD: COLOGUARD: NEGATIVE

## 2023-06-15 LAB — EXTERNAL GENERIC LAB PROCEDURE: COLOGUARD: NEGATIVE

## 2023-07-05 DIAGNOSIS — I10 Essential (primary) hypertension: Secondary | ICD-10-CM | POA: Diagnosis not present

## 2023-07-05 DIAGNOSIS — J309 Allergic rhinitis, unspecified: Secondary | ICD-10-CM | POA: Diagnosis not present

## 2023-07-05 DIAGNOSIS — Z008 Encounter for other general examination: Secondary | ICD-10-CM | POA: Diagnosis not present

## 2023-07-05 DIAGNOSIS — Z8249 Family history of ischemic heart disease and other diseases of the circulatory system: Secondary | ICD-10-CM | POA: Diagnosis not present

## 2023-07-05 DIAGNOSIS — F411 Generalized anxiety disorder: Secondary | ICD-10-CM | POA: Diagnosis not present

## 2023-07-05 DIAGNOSIS — Z6831 Body mass index (BMI) 31.0-31.9, adult: Secondary | ICD-10-CM | POA: Diagnosis not present

## 2023-07-05 DIAGNOSIS — E039 Hypothyroidism, unspecified: Secondary | ICD-10-CM | POA: Diagnosis not present

## 2023-07-05 DIAGNOSIS — E669 Obesity, unspecified: Secondary | ICD-10-CM | POA: Diagnosis not present

## 2023-07-05 DIAGNOSIS — G43909 Migraine, unspecified, not intractable, without status migrainosus: Secondary | ICD-10-CM | POA: Diagnosis not present

## 2023-07-05 DIAGNOSIS — E785 Hyperlipidemia, unspecified: Secondary | ICD-10-CM | POA: Diagnosis not present

## 2023-07-05 DIAGNOSIS — Z85828 Personal history of other malignant neoplasm of skin: Secondary | ICD-10-CM | POA: Diagnosis not present

## 2023-07-05 DIAGNOSIS — Z823 Family history of stroke: Secondary | ICD-10-CM | POA: Diagnosis not present

## 2023-08-21 IMAGING — US US THYROID
1 series · 16 of 25 positions shown · non-contrast
Comparison: 01/03/2022

CLINICAL DATA: Multinodular goiter

EXAM:
THYROID ULTRASOUND
TECHNIQUE: Ultrasound examination of the thyroid gland and adjacent soft
tissues was performed.

[Series 1: us thyroid · 0.06mm/px · 16 of 45 slices shown]
[im 1/45]
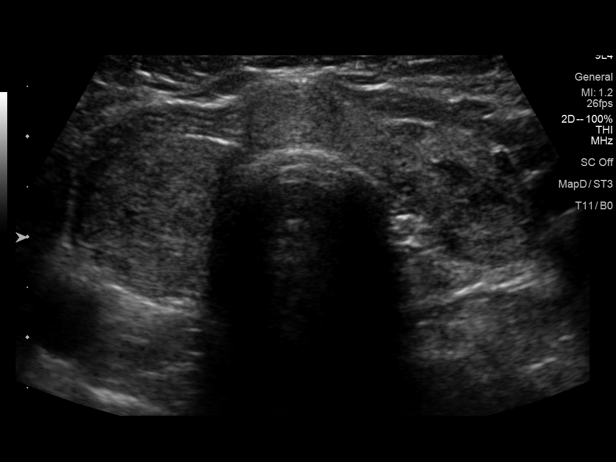
[im 4/45]
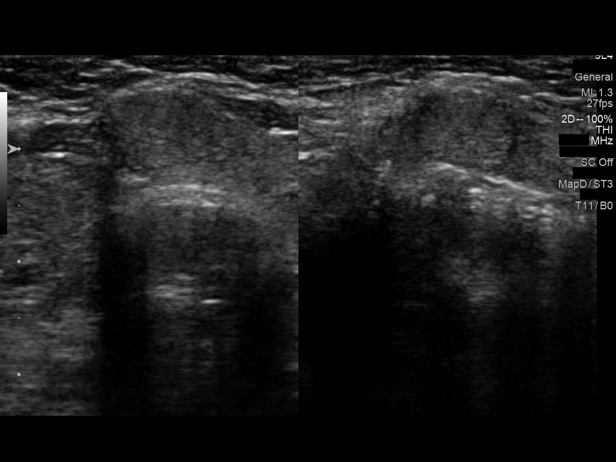
[im 6/45]
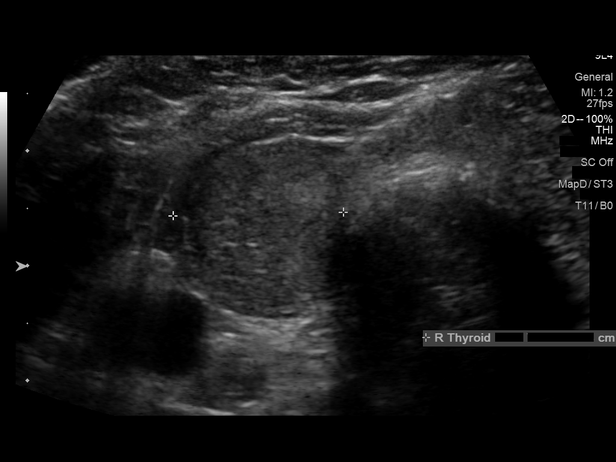
[im 10/45]
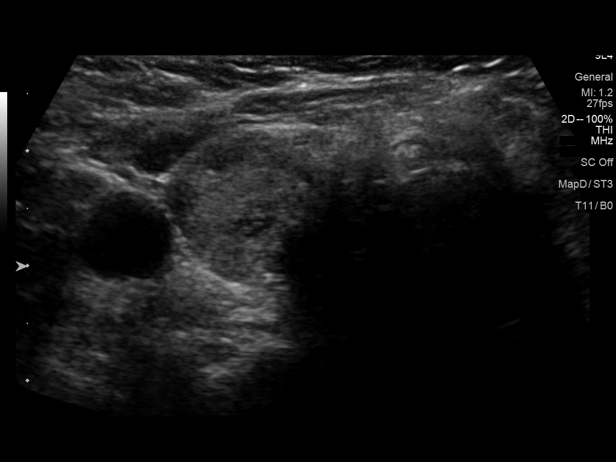
[im 13/45]
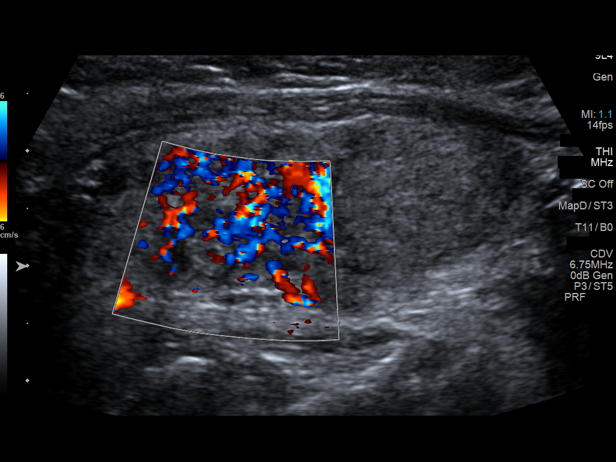
[im 15/45]
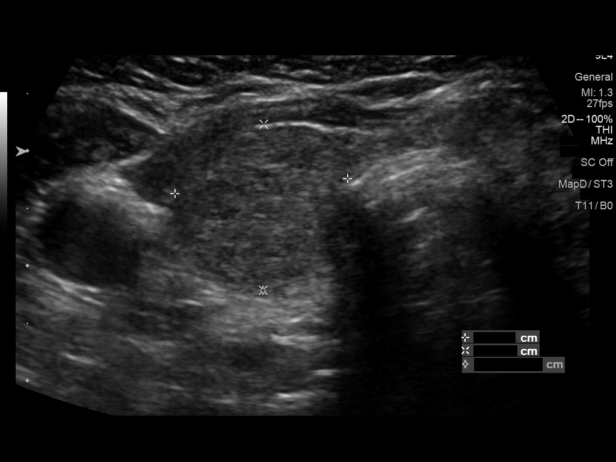
[im 19/45]
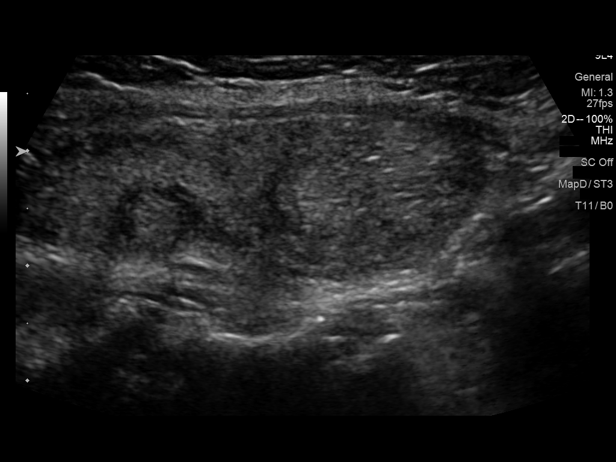
[im 21/45]
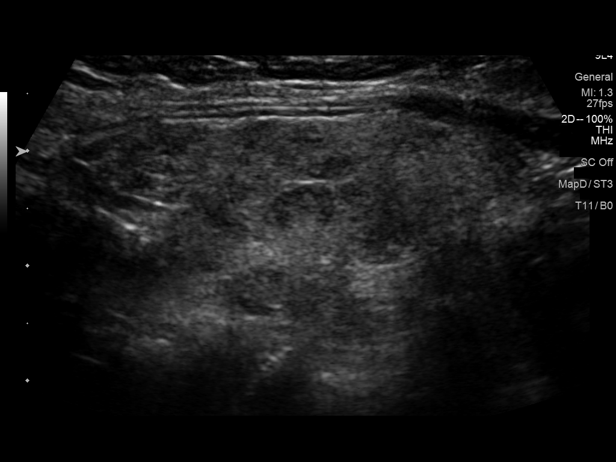
[im 24/45]
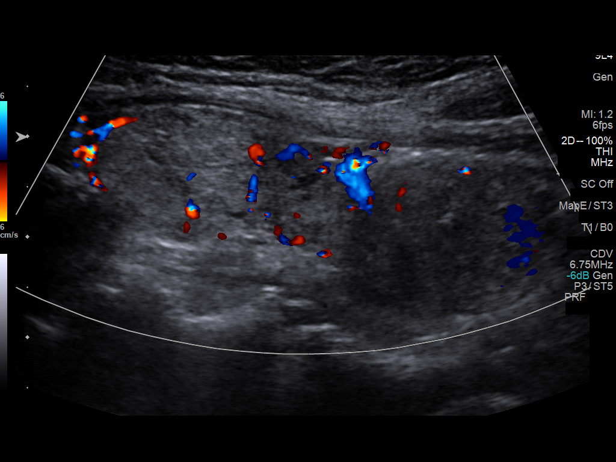
[im 26/45]
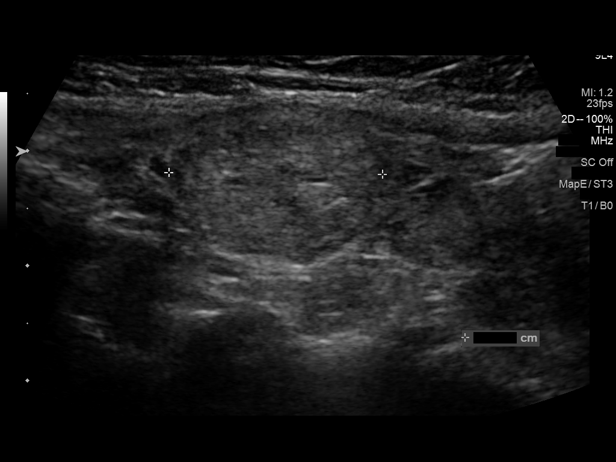
[im 30/45]
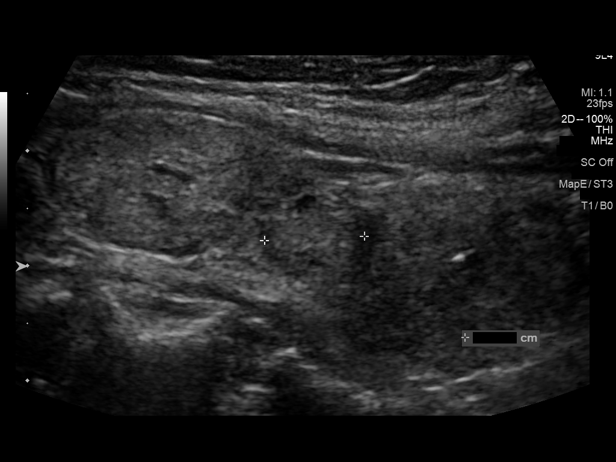
[im 32/45]
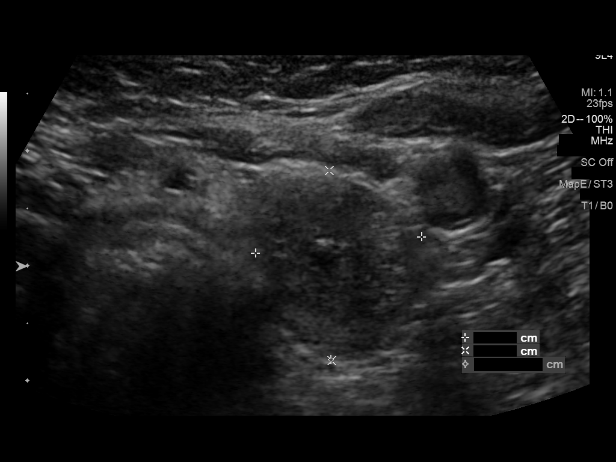
[im 35/45]
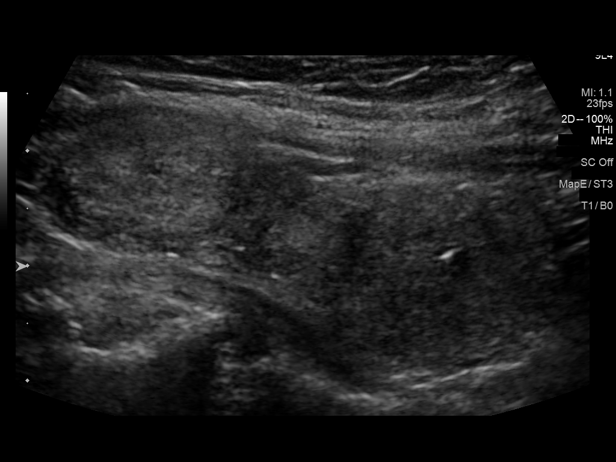
[im 39/45]
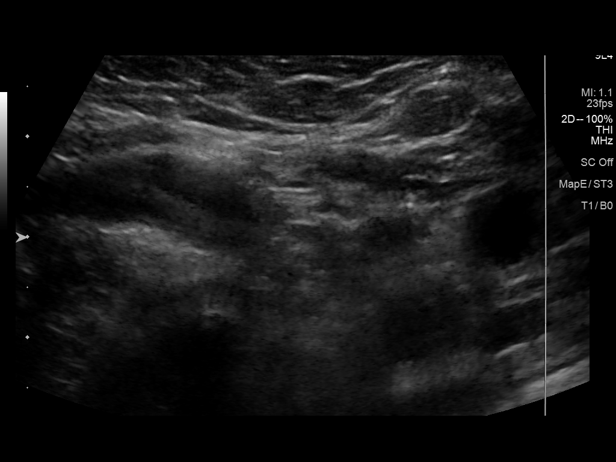
[im 41/45]
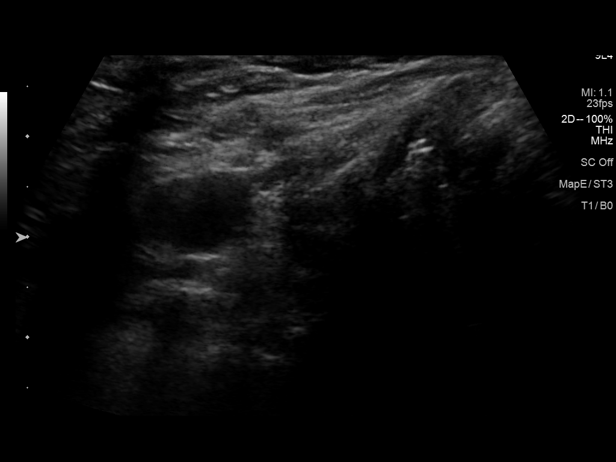
[im 45/45]
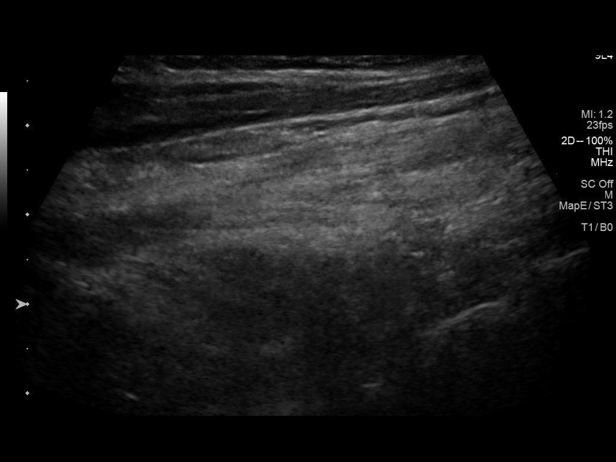

[16 of 25 positions shown; findings below may reference images not displayed]

FINDINGS: Parenchymal Echotexture: Mildly heterogeneous

Isthmus: 0.7 cm

Right lobe: 4.2 x 1.7 x 1.5 cm

Left lobe: 5.1 x 1.5 x 1.6 cm

_________________________________________________________

Estimated total number of nodules >/= 1 cm: 4

Number of spongiform nodules >/=  2 cm not described below (TR1): 0

Number of mixed cystic and solid nodules >/= 1.5 cm not described
below (TR2): 0

_________________________________________________________

Nodule # 1:

Location: Isthmus; mid

Maximum size: 1.0 cm; Other 2 dimensions: 0.7 x 0.9 cm

Composition: solid/almost completely solid (2)

Echogenicity: isoechoic (1)

Shape: not taller-than-wide (0)

Margins: ill-defined (0)

Echogenic foci: none (0)

ACR TI-RADS total points: 3.

ACR TI-RADS risk category: TR3 (3 points).

ACR TI-RADS recommendations:

Given size (<1.4 cm) and appearance, this nodule does NOT meet
TI-RADS criteria for biopsy or dedicated follow-up.

_________________________________________________________

Nodule # 2:

Location: Right; mid

Maximum size: 0.8 cm; Other 2 dimensions: 0.6 x 0.6 cm

Composition: solid/almost completely solid (2)

Echogenicity: hypoechoic (2)

Shape: not taller-than-wide (0)

Margins: smooth (0)

Echogenic foci: none (0)

ACR TI-RADS total points: 4.

ACR TI-RADS risk category: TR4 (4-6 points).

ACR TI-RADS recommendations:

Given size (<0.9 cm) and appearance, this nodule does NOT meet
TI-RADS criteria for biopsy or dedicated follow-up.

_________________________________________________________

Nodule # 3:

Location: Right; inferior

Maximum size: 1.8 cm; Other 2 dimensions: 1.5 x 1.5 cm

Composition: solid/almost completely solid (2)

Echogenicity: isoechoic (1)

Shape: not taller-than-wide (0)

Margins: ill-defined (0)

Echogenic foci: none (0)

ACR TI-RADS total points: 3.

ACR TI-RADS risk category: TR3 (3 points).

ACR TI-RADS recommendations:

*Given size (>/= 1.5 - 2.4 cm) and appearance, a follow-up
ultrasound in 1 year should be considered based on TI-RADS criteria.

_________________________________________________________

Nodule # 4:

Location: Left; superior

Maximum size: 1.9 cm; Other 2 dimensions: 1.5 x 1.3 cm

Composition: solid/almost completely solid (2)

Echogenicity: isoechoic (1)

Shape: not taller-than-wide (0)

Margins: ill-defined (0)

Echogenic foci: none (0)

ACR TI-RADS total points: 3.

ACR TI-RADS risk category: TR3 (3 points).

ACR TI-RADS recommendations:

*Given size (>/= 1.5 - 2.4 cm) and appearance, a follow-up
ultrasound in 1 year should be considered based on TI-RADS criteria.

_________________________________________________________

Nodule # 5:

Location: Left; mid

Maximum size: 0.9 cm; Other 2 dimensions: 0.9 x 0.7 cm

Composition: solid/almost completely solid (2)

Echogenicity: isoechoic (1)

Shape: not taller-than-wide (0)

Margins: ill-defined (0)

Echogenic foci: none (0)

ACR TI-RADS total points: 3.

ACR TI-RADS risk category: TR3 (3 points).

ACR TI-RADS recommendations:

Given size (<1.4 cm) and appearance, this nodule does NOT meet
TI-RADS criteria for biopsy or dedicated follow-up.

_________________________________________________________

Nodule # 6:

Location: Left; inferior

Maximum size: 2.0 cm; Other 2 dimensions: 1.7 x 1.5 cm

Composition: solid/almost completely solid (2)

Echogenicity: hypoechoic (2)

Shape: not taller-than-wide (0)

Margins: ill-defined (0)

Echogenic foci: macrocalcifications (1)

ACR TI-RADS total points: 5.

ACR TI-RADS risk category: TR4 (4-6 points).

ACR TI-RADS recommendations:

**Given size (>/= 1.5 cm) and appearance, fine needle aspiration of
this moderately suspicious nodule should be considered based on
TI-RADS criteria.

_________________________________________________________
IMPRESSION: 1. Nodule 6 (TI-RADS 4), located in the inferior left thyroid lobe,
measuring 2.0 x 1.7 x 1.5 cm, meets criteria for FNA.
2. Nodule 3 (TI-RADS 3), located in the inferior right thyroid lobe,
measuring 1.8 x 1.5 x 1.5 cm, meets criteria for follow-up. Annual
ultrasound surveillance is recommended until 5 years of stability is
documented.
3. Nodule 4 (TI-RADS 3), located in the superior left thyroid lobe,
measuring 1.9 x 1.5 x 1.3 cm, meets criteria for follow-up. Annual
ultrasound surveillance is recommended until 5 years of stability is
documented.
4. Nodules 1, 3, 4, and 5 were seen on the recent outside study and
are not significantly changed in size.

The above is in keeping with the ACR TI-RADS recommendations - [HOSPITAL] 6211;[DATE].

## 2023-09-02 IMAGING — US US FNA BIOPSY THYROID 1ST LESION
1 series · 11 of 11 positions shown · non-contrast
Comparison: Thyroid ultrasound performed 01/24/2022

MEDICATIONS:
1% plain lidocaine, 1 mL

COMPLICATIONS:
None immediate.

INDICATION: Indeterminate left thyroid nodule

EXAM:
ULTRASOUND GUIDED FINE NEEDLE ASPIRATION OF INDETERMINATE LEFT
THYROID NODULE
TECHNIQUE: Informed written consent was obtained from the patient after a
discussion of the risks, benefits and alternatives to treatment.
Questions regarding the procedure were encouraged and answered. A
timeout was performed prior to the initiation of the procedure.

[Series 1: us fna biopsy thyroid 1st lesion · 0.06mm/px · 11 acquisitions, 11 frames shown]
[im 1/11]
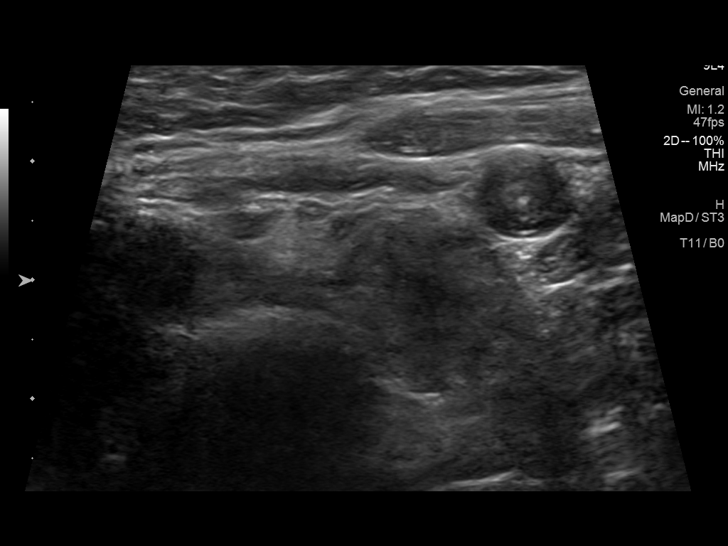
[im 2/11]
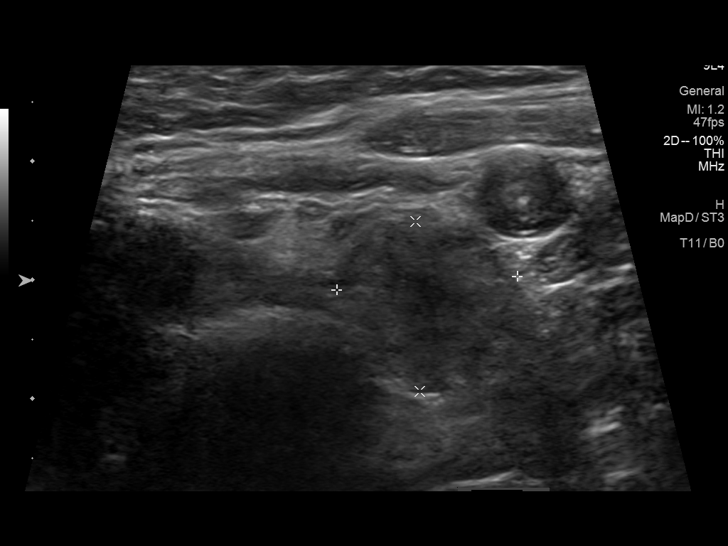
[im 3/11]
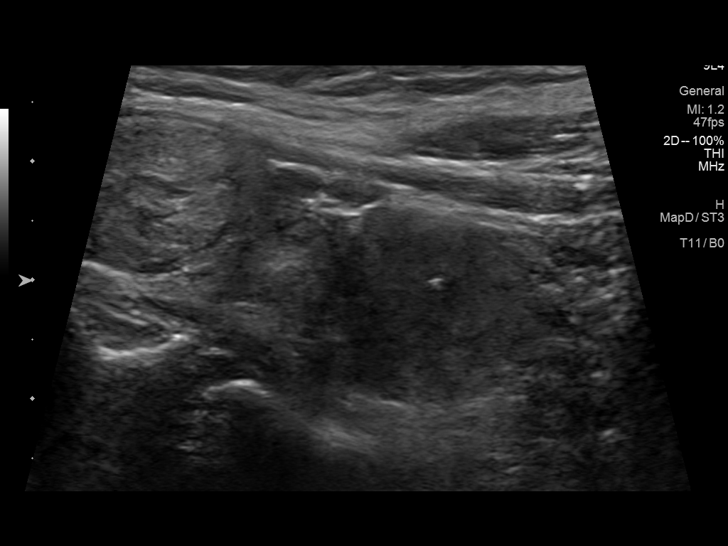
[im 4/11]
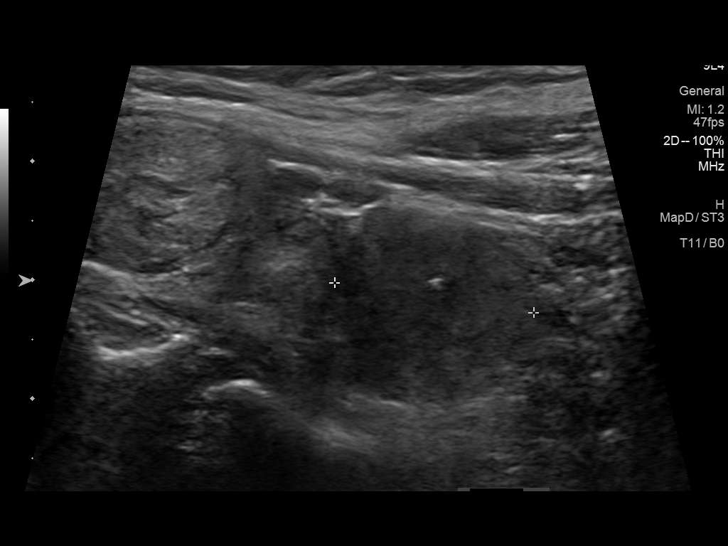
[im 5/11]
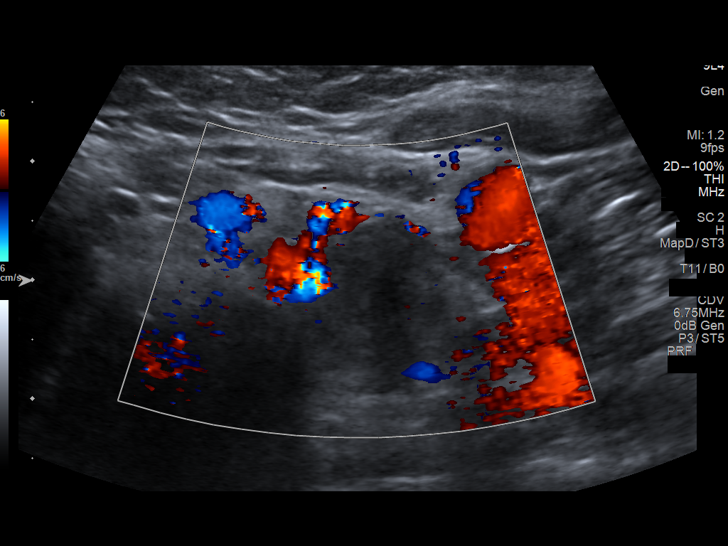
[im 6/11]
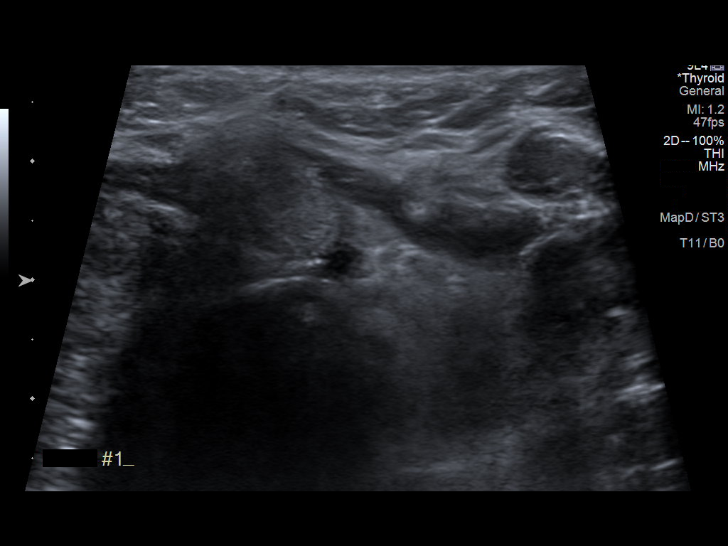
[im 7/11]
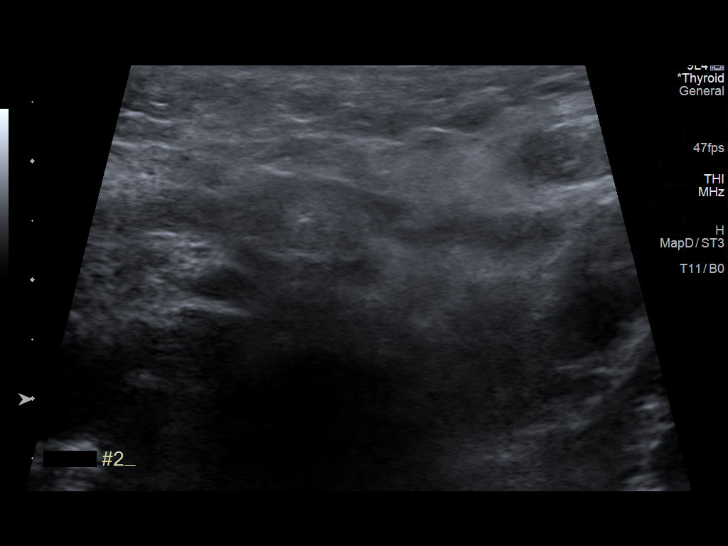
[im 8/11]
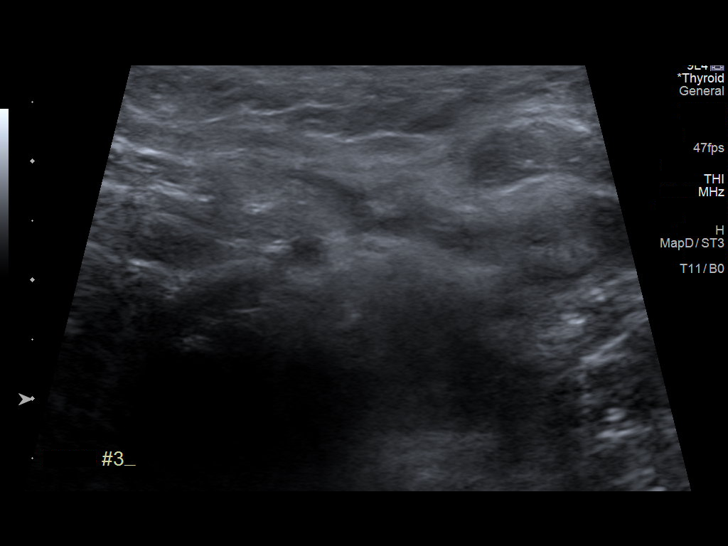
[im 9/11]
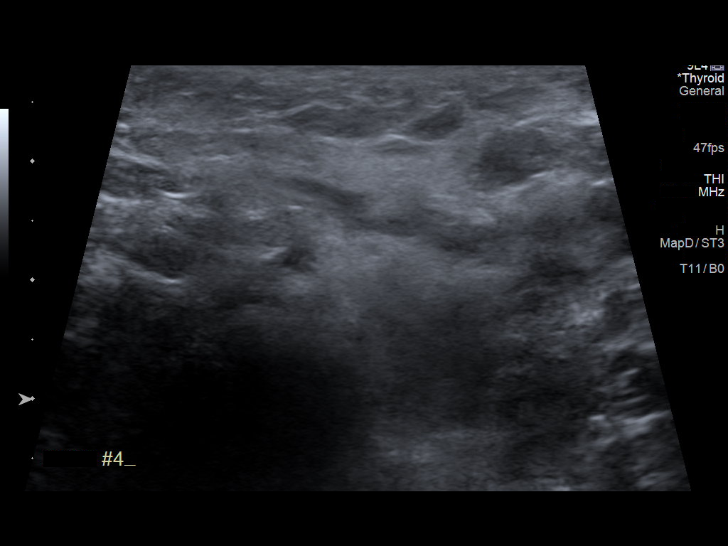
[im 10/11]
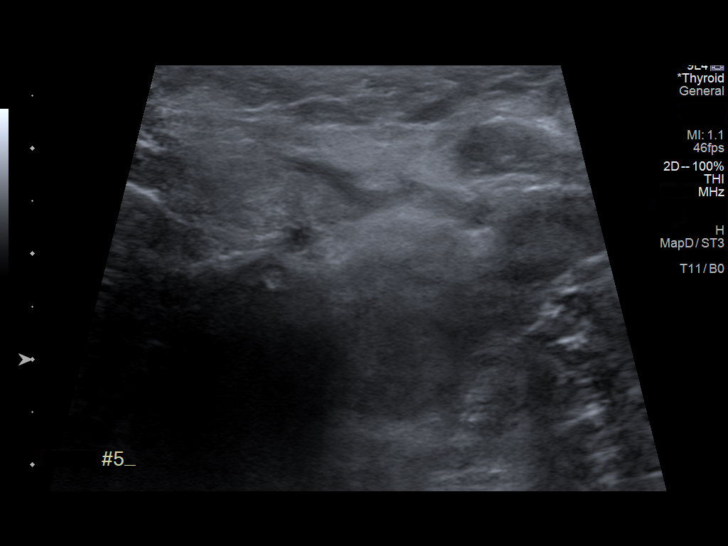
[im 11/11]
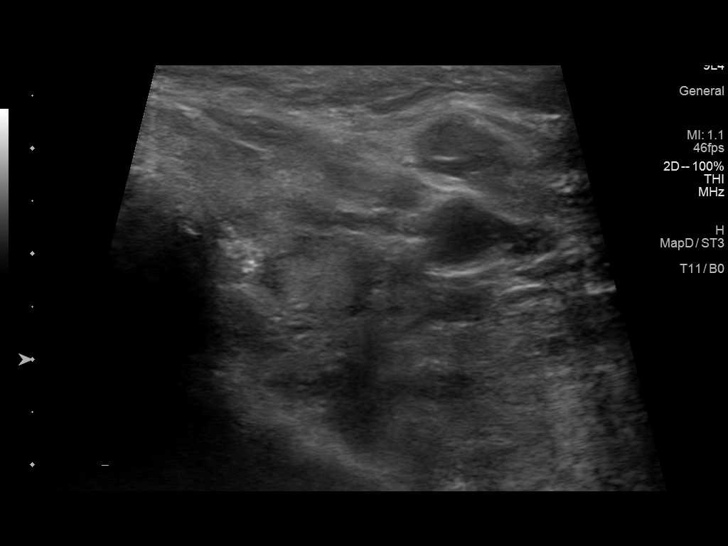

[11 of 11 positions shown; findings below may reference images not displayed]

Pre-procedural ultrasound scanning demonstrated unchanged size and
appearance of the indeterminate nodule within the left thyroid lobe

The procedure was planned. The neck was prepped in the usual sterile
fashion, and a sterile drape was applied covering the operative
field. A timeout was performed prior to the initiation of the
procedure. Local anesthesia was provided with 1% lidocaine.

Under direct ultrasound guidance, 5 FNA biopsies were performed of
the left inferior thyroid nodule with a 27 gauge needle. Multiple
ultrasound images were saved for procedural documentation purposes.
The samples were prepared and submitted to pathology.

Limited post procedural scanning was negative for hematoma or
additional complication. Dressings were placed. The patient
tolerated the above procedures procedure well without immediate
postprocedural complication.
FINDINGS: FINDINGS
Nodule reference number based on prior diagnostic ultrasound: 6

Maximum size: 2.0 cm

Location: Left  ;  Inferior

ACR TI-RADS total points: 5

ACR TI-RADS risk category:  TR4 (4-6 points)

Prior biopsy:  No

Reason for biopsy: meets ACR TI-RADS criteria

Ultrasound imaging confirms appropriate placement of the needles
within the thyroid nodule.
IMPRESSION: Technically successful ultrasound guided fine needle aspiration of
left inferior thyroid nodule as described above.

## 2023-10-01 DIAGNOSIS — E039 Hypothyroidism, unspecified: Secondary | ICD-10-CM | POA: Diagnosis not present

## 2023-10-08 DIAGNOSIS — E039 Hypothyroidism, unspecified: Secondary | ICD-10-CM | POA: Diagnosis not present

## 2023-10-08 DIAGNOSIS — E042 Nontoxic multinodular goiter: Secondary | ICD-10-CM | POA: Diagnosis not present

## 2023-11-13 DIAGNOSIS — H40053 Ocular hypertension, bilateral: Secondary | ICD-10-CM | POA: Diagnosis not present

## 2023-11-18 DIAGNOSIS — H2513 Age-related nuclear cataract, bilateral: Secondary | ICD-10-CM | POA: Diagnosis not present

## 2023-11-18 DIAGNOSIS — H401131 Primary open-angle glaucoma, bilateral, mild stage: Secondary | ICD-10-CM | POA: Diagnosis not present

## 2023-11-18 DIAGNOSIS — H5211 Myopia, right eye: Secondary | ICD-10-CM | POA: Diagnosis not present

## 2023-12-10 DIAGNOSIS — M546 Pain in thoracic spine: Secondary | ICD-10-CM | POA: Diagnosis not present

## 2023-12-12 DIAGNOSIS — R1013 Epigastric pain: Secondary | ICD-10-CM | POA: Diagnosis not present

## 2023-12-12 DIAGNOSIS — Z6833 Body mass index (BMI) 33.0-33.9, adult: Secondary | ICD-10-CM | POA: Diagnosis not present

## 2023-12-12 DIAGNOSIS — M549 Dorsalgia, unspecified: Secondary | ICD-10-CM | POA: Diagnosis not present

## 2023-12-18 DIAGNOSIS — M546 Pain in thoracic spine: Secondary | ICD-10-CM | POA: Diagnosis not present

## 2023-12-18 DIAGNOSIS — M542 Cervicalgia: Secondary | ICD-10-CM | POA: Diagnosis not present

## 2024-01-09 DIAGNOSIS — R109 Unspecified abdominal pain: Secondary | ICD-10-CM | POA: Diagnosis not present

## 2024-01-09 DIAGNOSIS — Z9049 Acquired absence of other specified parts of digestive tract: Secondary | ICD-10-CM | POA: Diagnosis not present

## 2024-01-09 DIAGNOSIS — D72829 Elevated white blood cell count, unspecified: Secondary | ICD-10-CM | POA: Diagnosis not present

## 2024-01-15 ENCOUNTER — Other Ambulatory Visit: Payer: Self-pay | Admitting: Surgery

## 2024-01-15 DIAGNOSIS — L570 Actinic keratosis: Secondary | ICD-10-CM | POA: Diagnosis not present

## 2024-01-15 DIAGNOSIS — E042 Nontoxic multinodular goiter: Secondary | ICD-10-CM

## 2024-01-15 DIAGNOSIS — L821 Other seborrheic keratosis: Secondary | ICD-10-CM | POA: Diagnosis not present

## 2024-01-15 DIAGNOSIS — D0439 Carcinoma in situ of skin of other parts of face: Secondary | ICD-10-CM | POA: Diagnosis not present

## 2024-01-15 DIAGNOSIS — E063 Autoimmune thyroiditis: Secondary | ICD-10-CM

## 2024-01-15 DIAGNOSIS — D485 Neoplasm of uncertain behavior of skin: Secondary | ICD-10-CM | POA: Diagnosis not present

## 2024-01-21 ENCOUNTER — Other Ambulatory Visit: Payer: Medicare HMO

## 2024-01-22 ENCOUNTER — Ambulatory Visit
Admission: RE | Admit: 2024-01-22 | Discharge: 2024-01-22 | Disposition: A | Discharge status: Home / Self Care | Payer: Medicare HMO | Source: Ambulatory Visit | Attending: Surgery | Admitting: Surgery

## 2024-01-22 DIAGNOSIS — E042 Nontoxic multinodular goiter: Secondary | ICD-10-CM | POA: Diagnosis not present

## 2024-01-22 DIAGNOSIS — E063 Autoimmune thyroiditis: Secondary | ICD-10-CM

## 2024-01-23 DIAGNOSIS — E039 Hypothyroidism, unspecified: Secondary | ICD-10-CM | POA: Diagnosis not present

## 2024-02-04 DIAGNOSIS — H401131 Primary open-angle glaucoma, bilateral, mild stage: Secondary | ICD-10-CM | POA: Diagnosis not present

## 2024-02-26 DIAGNOSIS — E063 Autoimmune thyroiditis: Secondary | ICD-10-CM | POA: Diagnosis not present

## 2024-02-26 DIAGNOSIS — E042 Nontoxic multinodular goiter: Secondary | ICD-10-CM | POA: Diagnosis not present

## 2024-04-13 DIAGNOSIS — H10413 Chronic giant papillary conjunctivitis, bilateral: Secondary | ICD-10-CM | POA: Diagnosis not present

## 2024-06-28 DIAGNOSIS — F411 Generalized anxiety disorder: Secondary | ICD-10-CM | POA: Diagnosis not present

## 2024-06-28 DIAGNOSIS — E039 Hypothyroidism, unspecified: Secondary | ICD-10-CM | POA: Diagnosis not present

## 2024-06-28 DIAGNOSIS — E7841 Elevated Lipoprotein(a): Secondary | ICD-10-CM | POA: Diagnosis not present

## 2024-07-21 DIAGNOSIS — Z85828 Personal history of other malignant neoplasm of skin: Secondary | ICD-10-CM | POA: Diagnosis not present

## 2024-07-21 DIAGNOSIS — L820 Inflamed seborrheic keratosis: Secondary | ICD-10-CM | POA: Diagnosis not present

## 2024-07-29 DIAGNOSIS — E7841 Elevated Lipoprotein(a): Secondary | ICD-10-CM | POA: Diagnosis not present

## 2024-07-29 DIAGNOSIS — E039 Hypothyroidism, unspecified: Secondary | ICD-10-CM | POA: Diagnosis not present

## 2024-07-29 DIAGNOSIS — F411 Generalized anxiety disorder: Secondary | ICD-10-CM | POA: Diagnosis not present

## 2024-08-29 DIAGNOSIS — E7841 Elevated Lipoprotein(a): Secondary | ICD-10-CM | POA: Diagnosis not present

## 2024-08-29 DIAGNOSIS — F411 Generalized anxiety disorder: Secondary | ICD-10-CM | POA: Diagnosis not present

## 2024-08-29 DIAGNOSIS — E039 Hypothyroidism, unspecified: Secondary | ICD-10-CM | POA: Diagnosis not present

## 2024-09-16 DIAGNOSIS — R051 Acute cough: Secondary | ICD-10-CM | POA: Diagnosis not present

## 2024-09-16 DIAGNOSIS — R0981 Nasal congestion: Secondary | ICD-10-CM | POA: Diagnosis not present

## 2024-09-28 DIAGNOSIS — F411 Generalized anxiety disorder: Secondary | ICD-10-CM | POA: Diagnosis not present

## 2024-09-28 DIAGNOSIS — E039 Hypothyroidism, unspecified: Secondary | ICD-10-CM | POA: Diagnosis not present

## 2024-09-28 DIAGNOSIS — E7841 Elevated Lipoprotein(a): Secondary | ICD-10-CM | POA: Diagnosis not present

## 2024-10-06 DIAGNOSIS — E039 Hypothyroidism, unspecified: Secondary | ICD-10-CM | POA: Diagnosis not present

## 2024-10-13 DIAGNOSIS — E039 Hypothyroidism, unspecified: Secondary | ICD-10-CM | POA: Diagnosis not present

## 2024-10-13 DIAGNOSIS — E042 Nontoxic multinodular goiter: Secondary | ICD-10-CM | POA: Diagnosis not present

## 2024-10-29 DIAGNOSIS — E039 Hypothyroidism, unspecified: Secondary | ICD-10-CM | POA: Diagnosis not present

## 2024-10-29 DIAGNOSIS — E7841 Elevated Lipoprotein(a): Secondary | ICD-10-CM | POA: Diagnosis not present

## 2024-10-29 DIAGNOSIS — F411 Generalized anxiety disorder: Secondary | ICD-10-CM | POA: Diagnosis not present

## 2024-11-23 DIAGNOSIS — E039 Hypothyroidism, unspecified: Secondary | ICD-10-CM | POA: Diagnosis not present

## 2024-11-28 DIAGNOSIS — E039 Hypothyroidism, unspecified: Secondary | ICD-10-CM | POA: Diagnosis not present

## 2024-11-28 DIAGNOSIS — E7841 Elevated Lipoprotein(a): Secondary | ICD-10-CM | POA: Diagnosis not present

## 2024-11-28 DIAGNOSIS — F411 Generalized anxiety disorder: Secondary | ICD-10-CM | POA: Diagnosis not present

## 2025-01-13 ENCOUNTER — Other Ambulatory Visit: Payer: Self-pay | Admitting: Family Medicine

## 2025-01-13 DIAGNOSIS — R928 Other abnormal and inconclusive findings on diagnostic imaging of breast: Secondary | ICD-10-CM

## 2025-01-17 ENCOUNTER — Ambulatory Visit
Admission: RE | Admit: 2025-01-17 | Discharge: 2025-01-17 | Disposition: A | Source: Ambulatory Visit | Attending: Family Medicine | Admitting: Family Medicine

## 2025-01-17 ENCOUNTER — Ambulatory Visit
Admission: RE | Admit: 2025-01-17 | Discharge: 2025-01-17 | Disposition: A | Discharge status: Home / Self Care | Source: Ambulatory Visit | Attending: Family Medicine | Admitting: Family Medicine

## 2025-01-17 DIAGNOSIS — R928 Other abnormal and inconclusive findings on diagnostic imaging of breast: Secondary | ICD-10-CM
